# Patient Record
Sex: Female | Born: 1950 | Race: White | Hispanic: No | Marital: Single | Smoking: Former smoker
Health system: Southern US, Community
[De-identification: ages and names within clinical notes are randomized; demographics above are authoritative.]

## PROBLEM LIST (undated history)

## (undated) DIAGNOSIS — J449 Chronic obstructive pulmonary disease, unspecified: Secondary | ICD-10-CM

## (undated) DIAGNOSIS — I10 Essential (primary) hypertension: Secondary | ICD-10-CM

## (undated) DIAGNOSIS — I509 Heart failure, unspecified: Secondary | ICD-10-CM

## (undated) DIAGNOSIS — N19 Unspecified kidney failure: Secondary | ICD-10-CM

## (undated) DIAGNOSIS — M1990 Unspecified osteoarthritis, unspecified site: Secondary | ICD-10-CM

## (undated) DIAGNOSIS — E785 Hyperlipidemia, unspecified: Secondary | ICD-10-CM

## (undated) HISTORY — PX: REPLACEMENT TOTAL KNEE: SUR1224

## (undated) HISTORY — PX: INSERTION OF DIALYSIS CATHETER: SHX1324

## (undated) HISTORY — PX: ABDOMINAL HYSTERECTOMY: SHX81

---

## 2016-10-06 ENCOUNTER — Observation Stay
Admission: EM | Admit: 2016-10-06 | Discharge: 2016-10-08 | Disposition: A | Discharge status: Home / Self Care | Payer: Medicare Other | Attending: Internal Medicine | Admitting: Internal Medicine

## 2016-10-06 ENCOUNTER — Encounter: Payer: Self-pay | Admitting: Emergency Medicine

## 2016-10-06 ENCOUNTER — Emergency Department: Payer: Medicare Other

## 2016-10-06 DIAGNOSIS — E8770 Fluid overload, unspecified: Secondary | ICD-10-CM | POA: Diagnosis present

## 2016-10-06 DIAGNOSIS — I080 Rheumatic disorders of both mitral and aortic valves: Secondary | ICD-10-CM | POA: Insufficient documentation

## 2016-10-06 DIAGNOSIS — R911 Solitary pulmonary nodule: Secondary | ICD-10-CM | POA: Diagnosis not present

## 2016-10-06 DIAGNOSIS — I2510 Atherosclerotic heart disease of native coronary artery without angina pectoris: Secondary | ICD-10-CM | POA: Insufficient documentation

## 2016-10-06 DIAGNOSIS — Z91013 Allergy to seafood: Secondary | ICD-10-CM | POA: Diagnosis not present

## 2016-10-06 DIAGNOSIS — R042 Hemoptysis: Secondary | ICD-10-CM | POA: Diagnosis not present

## 2016-10-06 DIAGNOSIS — N2889 Other specified disorders of kidney and ureter: Secondary | ICD-10-CM

## 2016-10-06 DIAGNOSIS — G4733 Obstructive sleep apnea (adult) (pediatric): Secondary | ICD-10-CM | POA: Diagnosis not present

## 2016-10-06 DIAGNOSIS — Z9981 Dependence on supplemental oxygen: Secondary | ICD-10-CM | POA: Insufficient documentation

## 2016-10-06 DIAGNOSIS — J189 Pneumonia, unspecified organism: Secondary | ICD-10-CM | POA: Insufficient documentation

## 2016-10-06 DIAGNOSIS — N2581 Secondary hyperparathyroidism of renal origin: Secondary | ICD-10-CM | POA: Insufficient documentation

## 2016-10-06 DIAGNOSIS — I509 Heart failure, unspecified: Secondary | ICD-10-CM | POA: Insufficient documentation

## 2016-10-06 DIAGNOSIS — I890 Lymphedema, not elsewhere classified: Secondary | ICD-10-CM | POA: Insufficient documentation

## 2016-10-06 DIAGNOSIS — Z992 Dependence on renal dialysis: Secondary | ICD-10-CM | POA: Insufficient documentation

## 2016-10-06 DIAGNOSIS — E278 Other specified disorders of adrenal gland: Secondary | ICD-10-CM | POA: Insufficient documentation

## 2016-10-06 DIAGNOSIS — E785 Hyperlipidemia, unspecified: Secondary | ICD-10-CM | POA: Diagnosis not present

## 2016-10-06 DIAGNOSIS — N186 End stage renal disease: Secondary | ICD-10-CM | POA: Insufficient documentation

## 2016-10-06 DIAGNOSIS — E669 Obesity, unspecified: Secondary | ICD-10-CM | POA: Insufficient documentation

## 2016-10-06 DIAGNOSIS — M6281 Muscle weakness (generalized): Secondary | ICD-10-CM

## 2016-10-06 DIAGNOSIS — I959 Hypotension, unspecified: Secondary | ICD-10-CM | POA: Insufficient documentation

## 2016-10-06 DIAGNOSIS — F329 Major depressive disorder, single episode, unspecified: Secondary | ICD-10-CM | POA: Insufficient documentation

## 2016-10-06 DIAGNOSIS — R0602 Shortness of breath: Secondary | ICD-10-CM | POA: Diagnosis present

## 2016-10-06 DIAGNOSIS — M1990 Unspecified osteoarthritis, unspecified site: Secondary | ICD-10-CM | POA: Insufficient documentation

## 2016-10-06 DIAGNOSIS — D631 Anemia in chronic kidney disease: Secondary | ICD-10-CM | POA: Insufficient documentation

## 2016-10-06 DIAGNOSIS — Z87891 Personal history of nicotine dependence: Secondary | ICD-10-CM | POA: Diagnosis not present

## 2016-10-06 DIAGNOSIS — R161 Splenomegaly, not elsewhere classified: Secondary | ICD-10-CM | POA: Diagnosis not present

## 2016-10-06 DIAGNOSIS — J440 Chronic obstructive pulmonary disease with acute lower respiratory infection: Secondary | ICD-10-CM | POA: Diagnosis not present

## 2016-10-06 DIAGNOSIS — I132 Hypertensive heart and chronic kidney disease with heart failure and with stage 5 chronic kidney disease, or end stage renal disease: Principal | ICD-10-CM | POA: Insufficient documentation

## 2016-10-06 DIAGNOSIS — Z6830 Body mass index (BMI) 30.0-30.9, adult: Secondary | ICD-10-CM | POA: Insufficient documentation

## 2016-10-06 DIAGNOSIS — Z96659 Presence of unspecified artificial knee joint: Secondary | ICD-10-CM | POA: Insufficient documentation

## 2016-10-06 HISTORY — DX: Chronic obstructive pulmonary disease, unspecified: J44.9

## 2016-10-06 HISTORY — DX: Heart failure, unspecified: I50.9

## 2016-10-06 HISTORY — DX: Essential (primary) hypertension: I10

## 2016-10-06 HISTORY — DX: Unspecified osteoarthritis, unspecified site: M19.90

## 2016-10-06 HISTORY — DX: Unspecified kidney failure: N19

## 2016-10-06 HISTORY — DX: Hyperlipidemia, unspecified: E78.5

## 2016-10-06 LAB — COMPREHENSIVE METABOLIC PANEL
ALT: 16 U/L (ref 14–54)
ANION GAP: 13 (ref 5–15)
AST: 27 U/L (ref 15–41)
Albumin: 3.9 g/dL (ref 3.5–5.0)
Alkaline Phosphatase: 154 U/L — ABNORMAL HIGH (ref 38–126)
BILIRUBIN TOTAL: 1.7 mg/dL — AB (ref 0.3–1.2)
BUN: 29 mg/dL — ABNORMAL HIGH (ref 6–20)
CO2: 28 mmol/L (ref 22–32)
Calcium: 8.8 mg/dL — ABNORMAL LOW (ref 8.9–10.3)
Chloride: 100 mmol/L — ABNORMAL LOW (ref 101–111)
Creatinine, Ser: 5.35 mg/dL — ABNORMAL HIGH (ref 0.44–1.00)
GFR calc Af Amer: 9 mL/min — ABNORMAL LOW (ref >60)
GFR, EST NON AFRICAN AMERICAN: 8 mL/min — AB (ref >60)
Glucose, Bld: 87 mg/dL (ref 65–99)
POTASSIUM: 4.2 mmol/L (ref 3.5–5.1)
Sodium: 141 mmol/L (ref 135–145)
TOTAL PROTEIN: 7.4 g/dL (ref 6.5–8.1)

## 2016-10-06 LAB — CBC WITH DIFFERENTIAL/PLATELET
Basophils Absolute: 0 10*3/uL (ref 0–0.1)
Basophils Relative: 1 %
Eosinophils Absolute: 0 10*3/uL (ref 0–0.7)
Eosinophils Relative: 1 %
HEMATOCRIT: 29.7 % — AB (ref 35.0–47.0)
Hemoglobin: 9.9 g/dL — ABNORMAL LOW (ref 12.0–16.0)
LYMPHS PCT: 12 %
Lymphs Abs: 0.4 10*3/uL — ABNORMAL LOW (ref 1.0–3.6)
MCH: 33.1 pg (ref 26.0–34.0)
MCHC: 33.3 g/dL (ref 32.0–36.0)
MCV: 99.5 fL (ref 80.0–100.0)
MONO ABS: 0.2 10*3/uL (ref 0.2–0.9)
MONOS PCT: 5 %
NEUTROS ABS: 3 10*3/uL (ref 1.4–6.5)
Neutrophils Relative %: 81 %
Platelets: 121 10*3/uL — ABNORMAL LOW (ref 150–440)
RBC: 2.99 MIL/uL — ABNORMAL LOW (ref 3.80–5.20)
RDW: 18.8 % — AB (ref 11.5–14.5)
WBC: 3.7 10*3/uL (ref 3.6–11.0)

## 2016-10-06 LAB — CREATININE, SERUM
CREATININE: 5.76 mg/dL — AB (ref 0.44–1.00)
GFR calc Af Amer: 8 mL/min — ABNORMAL LOW (ref >60)
GFR, EST NON AFRICAN AMERICAN: 7 mL/min — AB (ref >60)

## 2016-10-06 LAB — BRAIN NATRIURETIC PEPTIDE: B NATRIURETIC PEPTIDE 5: 4168 pg/mL — AB (ref 0.0–100.0)

## 2016-10-06 LAB — CBC
HCT: 27.5 % — ABNORMAL LOW (ref 35.0–47.0)
Hemoglobin: 9.2 g/dL — ABNORMAL LOW (ref 12.0–16.0)
MCH: 32.8 pg (ref 26.0–34.0)
MCHC: 33.3 g/dL (ref 32.0–36.0)
MCV: 98.4 fL (ref 80.0–100.0)
Platelets: 104 10*3/uL — ABNORMAL LOW (ref 150–440)
RBC: 2.8 MIL/uL — ABNORMAL LOW (ref 3.80–5.20)
RDW: 19.1 % — ABNORMAL HIGH (ref 11.5–14.5)
WBC: 3.6 10*3/uL (ref 3.6–11.0)

## 2016-10-06 LAB — RENAL FUNCTION PANEL
ALBUMIN: 3.4 g/dL — AB (ref 3.5–5.0)
Anion gap: 9 (ref 5–15)
BUN: 29 mg/dL — AB (ref 6–20)
CALCIUM: 8.5 mg/dL — AB (ref 8.9–10.3)
CO2: 30 mmol/L (ref 22–32)
CREATININE: 5.57 mg/dL — AB (ref 0.44–1.00)
Chloride: 103 mmol/L (ref 101–111)
GFR calc Af Amer: 8 mL/min — ABNORMAL LOW (ref >60)
GFR calc non Af Amer: 7 mL/min — ABNORMAL LOW (ref >60)
GLUCOSE: 109 mg/dL — AB (ref 65–99)
PHOSPHORUS: 4.5 mg/dL (ref 2.5–4.6)
Potassium: 4.3 mmol/L (ref 3.5–5.1)
SODIUM: 142 mmol/L (ref 135–145)

## 2016-10-06 LAB — TROPONIN I
Troponin I: <0.03 ng/mL (ref <0.03)
Troponin I: <0.03 ng/mL (ref <0.03)

## 2016-10-06 MED ORDER — IPRATROPIUM-ALBUTEROL 0.5-2.5 (3) MG/3ML IN SOLN
3.0000 mL | Freq: Three times a day (TID) | RESPIRATORY_TRACT | Status: DC
Start: 2016-10-06 — End: 2016-10-06
  Administered 2016-10-06: 3 mL via RESPIRATORY_TRACT
  Filled 2016-10-06: qty 3

## 2016-10-06 MED ORDER — HEPARIN SODIUM (PORCINE) 5000 UNIT/ML IJ SOLN
5000.0000 [IU] | Freq: Three times a day (TID) | INTRAMUSCULAR | Status: DC
  Administered 2016-10-06 – 2016-10-08 (×5): 5000 [IU] via SUBCUTANEOUS
  Filled 2016-10-06 (×5): qty 1

## 2016-10-06 MED ORDER — DOCUSATE SODIUM 100 MG PO CAPS
100.0000 mg | ORAL_CAPSULE | Freq: Two times a day (BID) | ORAL | Status: DC
  Administered 2016-10-07 – 2016-10-08 (×3): 100 mg via ORAL
  Filled 2016-10-06 (×4): qty 1

## 2016-10-06 MED ORDER — ACETAMINOPHEN 325 MG PO TABS: 650.0000 mg | ORAL_TABLET | Freq: Four times a day (QID) | ORAL | Status: DC | PRN

## 2016-10-06 MED ORDER — SODIUM CHLORIDE 0.9 % IV SOLN: 250.0000 mL | INTRAVENOUS | Status: DC | PRN

## 2016-10-06 MED ORDER — RENA-VITE PO TABS
1.0000 | ORAL_TABLET | Freq: Every day | ORAL | Status: DC
  Administered 2016-10-06 – 2016-10-08 (×3): 1 via ORAL
  Filled 2016-10-06 (×3): qty 1

## 2016-10-06 MED ORDER — ACETAMINOPHEN 650 MG RE SUPP: 650.0000 mg | Freq: Four times a day (QID) | RECTAL | Status: DC | PRN

## 2016-10-06 MED ORDER — OXYCODONE HCL 5 MG PO TABS
5.0000 mg | ORAL_TABLET | Freq: Three times a day (TID) | ORAL | Status: DC
  Administered 2016-10-06 – 2016-10-08 (×6): 5 mg via ORAL
  Filled 2016-10-06 (×6): qty 1

## 2016-10-06 MED ORDER — FUROSEMIDE 10 MG/ML IJ SOLN
60.0000 mg | Freq: Once | INTRAMUSCULAR | Status: AC
  Administered 2016-10-06: 60 mg via INTRAVENOUS
  Filled 2016-10-06: qty 8

## 2016-10-06 MED ORDER — SERTRALINE HCL 100 MG PO TABS
100.0000 mg | ORAL_TABLET | Freq: Every day | ORAL | Status: DC
  Administered 2016-10-06 – 2016-10-07 (×2): 100 mg via ORAL
  Filled 2016-10-06 (×2): qty 1

## 2016-10-06 MED ORDER — TRAZODONE HCL 50 MG PO TABS
25.0000 mg | ORAL_TABLET | Freq: Every evening | ORAL | Status: DC | PRN
  Administered 2016-10-07 (×2): 25 mg via ORAL
  Filled 2016-10-06 (×2): qty 1

## 2016-10-06 MED ORDER — LABETALOL HCL 200 MG PO TABS
200.0000 mg | ORAL_TABLET | Freq: Two times a day (BID) | ORAL | Status: DC
  Administered 2016-10-06: 200 mg via ORAL
  Filled 2016-10-06 (×2): qty 1

## 2016-10-06 MED ORDER — SODIUM CHLORIDE 0.9% FLUSH
3.0000 mL | Freq: Two times a day (BID) | INTRAVENOUS | Status: DC
  Administered 2016-10-06 – 2016-10-08 (×4): 3 mL via INTRAVENOUS

## 2016-10-06 MED ORDER — BISACODYL 5 MG PO TBEC: 5.0000 mg | DELAYED_RELEASE_TABLET | Freq: Every day | ORAL | Status: DC | PRN

## 2016-10-06 MED ORDER — ONDANSETRON HCL 4 MG/2ML IJ SOLN: 4.0000 mg | Freq: Four times a day (QID) | INTRAMUSCULAR | Status: DC | PRN

## 2016-10-06 MED ORDER — IPRATROPIUM-ALBUTEROL 0.5-2.5 (3) MG/3ML IN SOLN
3.0000 mL | Freq: Three times a day (TID) | RESPIRATORY_TRACT | Status: DC
  Administered 2016-10-07 – 2016-10-08 (×3): 3 mL via RESPIRATORY_TRACT
  Filled 2016-10-06 (×4): qty 3

## 2016-10-06 MED ORDER — ONDANSETRON HCL 4 MG PO TABS
4.0000 mg | ORAL_TABLET | Freq: Four times a day (QID) | ORAL | Status: DC | PRN
  Administered 2016-10-07: 4 mg via ORAL
  Filled 2016-10-06: qty 1

## 2016-10-06 MED ORDER — HYDROCODONE-ACETAMINOPHEN 5-325 MG PO TABS: 1.0000 | ORAL_TABLET | ORAL | Status: DC | PRN

## 2016-10-06 MED ORDER — SODIUM CHLORIDE 0.9% FLUSH: 3.0000 mL | INTRAVENOUS | Status: DC | PRN

## 2016-10-06 MED ORDER — NITROGLYCERIN 2 % TD OINT
0.5000 [in_us] | TOPICAL_OINTMENT | Freq: Four times a day (QID) | TRANSDERMAL | Status: DC
  Administered 2016-10-06 – 2016-10-07 (×5): 0.5 [in_us] via TOPICAL
  Filled 2016-10-06 (×6): qty 1

## 2016-10-06 MED ORDER — ALBUTEROL SULFATE (2.5 MG/3ML) 0.083% IN NEBU: 2.5000 mg | INHALATION_SOLUTION | Freq: Four times a day (QID) | RESPIRATORY_TRACT | Status: DC | PRN

## 2016-10-06 MED ORDER — ALBUTEROL SULFATE (2.5 MG/3ML) 0.083% IN NEBU
5.0000 mg | INHALATION_SOLUTION | Freq: Once | RESPIRATORY_TRACT | Status: AC
  Administered 2016-10-06: 5 mg via RESPIRATORY_TRACT
  Filled 2016-10-06: qty 6

## 2016-10-06 MED ORDER — HYDRALAZINE HCL 25 MG PO TABS
25.0000 mg | ORAL_TABLET | Freq: Three times a day (TID) | ORAL | Status: DC
  Administered 2016-10-07 – 2016-10-08 (×3): 25 mg via ORAL
  Filled 2016-10-06 (×5): qty 1

## 2016-10-06 MED ORDER — IPRATROPIUM-ALBUTEROL 0.5-2.5 (3) MG/3ML IN SOLN: 3.0000 mL | Freq: Four times a day (QID) | RESPIRATORY_TRACT | Status: DC

## 2016-10-06 MED ORDER — CINACALCET HCL 30 MG PO TABS
30.0000 mg | ORAL_TABLET | Freq: Every day | ORAL | Status: DC
  Administered 2016-10-07 – 2016-10-08 (×2): 30 mg via ORAL
  Filled 2016-10-06 (×2): qty 1

## 2016-10-06 NOTE — ED Provider Notes (Signed)
[facility] Emergency Department Provider Note  ____________________________________________   I have reviewed the triage vital signs and the nursing notes.   HISTORY  Chief Complaint Shortness of Breath    HPI Sheri Myers is a 65 y.o. female who presents complaining of shortness of breath. Patient has a history of CHF COPD on 2 L home oxygen she states she moved her wheelchair this morning and became so when she could not breathe. She went to dialysis where she is scheduled to have dialysis today, they elected to send her here instead of performing dialysis because she was so short of breath. Patient is in the process of moving from [place]. She does have dialysis here but is yet to see her primary care doctor. She also has a history of CHF. Patient has not been taking any of her medications for the last few days and she cannot recall the name of any of her medications. She states something about the move and it hard for her to take her meds. Patient does not have a fever or productive cough. She denies chest pain she is just very short of breath. Patient states she was on her oxygen all night and that it was functioning.      Past Medical History:  Diagnosis Date  . Arthritis   . CHF (congestive heart failure) (HCC)   . COPD (chronic obstructive pulmonary disease) (HCC)   . Hypertension   . Kidney failure     There are no active problems to display for this patient.   Past Surgical History:  Procedure Laterality Date  . ABDOMINAL HYSTERECTOMY    . INSERTION OF DIALYSIS CATHETER    . REPLACEMENT TOTAL KNEE      Prior to Admission medications   Not on File    Allergies Shellfish allergy  No family history on file.  Social History Social History  Substance Use Topics  . Smoking status: Former Games developermoker  . Smokeless tobacco: Never Used  . Alcohol use No    Review of Systems Constitutional: No fever/chills Eyes: No visual  changes. ENT: No sore throat. No stiff neck no neck pain Cardiovascular: Denies chest pain. Respiratory: Positive shortness of breath. Gastrointestinal:   no vomiting.  No diarrhea.  No constipation. Genitourinary: Negative for dysuria. Musculoskeletal: Negative lower extremity swelling Skin: Negative for rash. Neurological: Negative for severe headaches, focal weakness or numbness. 10-point ROS otherwise negative.  ____________________________________________   PHYSICAL EXAM:  VITAL SIGNS: ED Triage Vitals  Enc Vitals Group     BP 10/06/16 1236 (!) 188/144     Pulse Rate 10/06/16 1236 89     Resp 10/06/16 1236 (!) 32     Temp 10/06/16 1236 97.6 F (36.4 C)     Temp Source 10/06/16 1236 Oral     SpO2 10/06/16 1236 99 %     Weight 10/06/16 1238 150 lb (68 kg)     Height 10/06/16 1238 5\' 2"  (1.575 m)     Head Circumference --      Peak Flow --      Pain Score --      Pain Loc --      Pain Edu? --      Excl. in GC? --     Constitutional: Alert and oriented. Well appearing and in no acute distress. Eyes: Conjunctivae are normal. PERRL. EOMI. Head: Atraumatic. Nose: No congestion/rhinnorhea. Mouth/Throat: Mucous membranes are moist.  Oropharynx non-erythematous. Neck: No stridor.   Nontender with no  meningismus Cardiovascular: Normal rate, regular rhythm. Grossly normal heart sounds.  Good peripheral circulation. Respiratory: Patient with increased respiratory rate, slight wheezes diminished in the bases. Abdominal: Soft and nontender. No distention. No guarding no rebound Back:  There is no focal tenderness or step off.  there is no midline tenderness there are no lesions noted. there is no CVA tenderness Musculoskeletal: No lower extremity tenderness, no upper extremity tenderness. No joint effusions, no DVT signs strong distal pulses no edema Neurologic:  Normal speech and language. No gross focal neurologic deficits are appreciated.  Skin:  Skin is warm, dry and intact.  No rash noted. Psychiatric: Mood and affect are normal. Speech and behavior are normal.  ____________________________________________   LABS (all labs ordered are listed, but only abnormal results are displayed)  Labs Reviewed  BRAIN NATRIURETIC PEPTIDE - Abnormal; Notable for the following:       Result Value   B Natriuretic Peptide 4,168.0 (*)    All other components within normal limits  CBC WITH DIFFERENTIAL/PLATELET - Abnormal; Notable for the following:    RBC 2.99 (*)    Hemoglobin 9.9 (*)    HCT 29.7 (*)    RDW 18.8 (*)    Platelets 121 (*)    Lymphs Abs 0.4 (*)    All other components within normal limits  COMPREHENSIVE METABOLIC PANEL - Abnormal; Notable for the following:    Chloride 100 (*)    BUN 29 (*)    Creatinine, Ser 5.35 (*)    Calcium 8.8 (*)    Alkaline Phosphatase 154 (*)    Total Bilirubin 1.7 (*)    GFR calc non Af Amer 8 (*)    GFR calc Af Amer 9 (*)    All other components within normal limits  TROPONIN I   ____________________________________________  EKG  I personally interpreted any EKGs ordered by me or triage Sinus rhythm rate 52 beats per an acute ST elevation or depression normal axis bradycardia noted. ____________________________________________  RADIOLOGY  I reviewed any imaging ordered by me or triage that were performed during my shift and, if possible, patient and/or family made aware of any abnormal findings. ____________________________________________   PROCEDURES  Procedure(s) performed: None  Procedures  Critical Care performed: None  ____________________________________________   INITIAL IMPRESSION / ASSESSMENT AND PLAN / ED COURSE  Pertinent labs & imaging results that were available during my care of the patient were reviewed by me and considered in my medical decision making (see chart for details).  Patient significantly short of breath. She denies history of panic attacks which she was somewhat anxious.  Blood pressure was quite high. We did elect to give her breathing treatment and observe her. At this time she is breathing much more easily, oxygen saturation is reassuring chest x-ray shows atelectasis versus pneumonia. Given absence of fever or productive cough I tend to think this is more likely atelectatic. She has had episodes like this before. Unfortunately she has not of her home medications, has missed dialysis today, and still feels somewhat short of breath when she moves. I think she likely would benefit from an observational stay  Clinical Course    ____________________________________________   FINAL CLINICAL IMPRESSION(S) / ED DIAGNOSES  Final diagnoses:  SOB (shortness of breath)  SOB (shortness of breath)      This chart was dictated using voice recognition software.  Despite best efforts to proofread,  errors can occur which can change meaning.      Jeanmarie Plant,  MD 10/06/16 1416

## 2016-10-06 NOTE — Consult Note (Signed)
[facility]  CONSULT NOTE    Date: 10/06/2016                  Patient Name:  Sheri PennaDeborah Myers  MRN: [id number]  DOB: 10-26-1951  Age / Sex: 65 y.o., female         PCP: Jerl MinaJames Hedrick, MD                 Service Requesting Consult: Dr. Karlene LinemanV Shah                 Reason for Consult: End Stage Renal Disease            History of Present Illness: Sheri Myers is a 65 y.o. white female with end stage renal disease on hemodialysis since 2007, COPD, obesity, hypertension, congestive heart failure, coronary artery disease, obstructive sleep apnea, hyperlipidemia, lymphedema, who was [facility] to [facility] on 10/06/2016 for end stage renal disease.   Patient recently moved from [place]. [facility] [address] She went for dialysis, but was sent to ED due to shortness of breath.    Medications: Outpatient medications: No prescriptions prior to admission.    Current medications: No current facility-administered medications for this encounter.       Allergies: Allergies  Allergen Reactions  . Shellfish Allergy Hives      Past Medical History: Past Medical History:  Diagnosis Date  . Arthritis   . CHF (congestive heart failure) (HCC)   . COPD (chronic obstructive pulmonary disease) (HCC)   . Hypertension   . Kidney failure      Past Surgical History: Past Surgical History:  Procedure Laterality Date  . ABDOMINAL HYSTERECTOMY    . INSERTION OF DIALYSIS CATHETER    . REPLACEMENT TOTAL KNEE       Family History: No family history on file.   Social History: Social History   Social History  . Marital status: Single    Spouse name: N/A  . Number of children: N/A  . Years of education: N/A   Occupational History  . Not on file.   Social History Main Topics  . Smoking status: Former Games developermoker  . Smokeless tobacco: Never Used  . Alcohol use No  . Drug use: Unknown  . Sexual activity: Not on file   Other Topics Concern  .  Not on file   Social History Narrative  . No narrative on file     Review of Systems: Review of Systems  Constitutional: Positive for diaphoresis, malaise/fatigue and weight loss. Negative for chills and fever.  HENT: Negative.  Negative for congestion, ear discharge, ear pain, hearing loss, nosebleeds, sinus pain, sore throat and tinnitus.   Eyes: Negative for blurred vision, double vision, photophobia, pain, discharge and redness.  Respiratory: Positive for cough, shortness of breath and wheezing. Negative for sputum production and stridor.   Cardiovascular: Positive for orthopnea, leg swelling and PND. Negative for chest pain, palpitations and claudication.  Gastrointestinal: Negative.  Negative for abdominal pain, blood in stool, constipation, diarrhea, heartburn, melena, nausea and vomiting.  Genitourinary: Negative.  Negative for dysuria, flank pain, frequency, hematuria and urgency.  Musculoskeletal: Negative.  Negative for back pain, falls, joint pain, myalgias and neck pain.  Skin: Negative.  Negative for itching and rash.  Neurological: Negative.  Negative for dizziness, tingling, tremors, sensory change, speech change, focal weakness, seizures, loss of consciousness, weakness and headaches.  Endo/Heme/Allergies: Negative.  Negative for environmental allergies and polydipsia. Does not  bruise/bleed easily.  Psychiatric/Behavioral: Negative.  Negative for depression, hallucinations, memory loss, substance abuse and suicidal ideas. The patient is not nervous/anxious and does not have insomnia.     Vital Signs: Blood pressure (!) 175/47, pulse 60, temperature 97.5 F (36.4 C), temperature source Oral, resp. rate 17, height 5\' 2"  (1.575 m), weight 68 kg (150 lb), SpO2 95 %.  Weight trends: Filed Weights   10/06/16 1238  Weight: 68 kg (150 lb)    Physical Exam: General: NAD, laying shortness of breath   Head: Normocephalic, atraumatic. Moist oral mucosal membranes  Eyes:  Anicteric, PERRL  Neck: Supple, trachea midline  Lungs:  Bilateral crackles  Heart: Regular rate and rhythm  Abdomen:  Soft, nontender, obese  Extremities:  1+ peripheral edema.  Neurologic: Nonfocal, moving all four extremities  Skin: No lesions  Access: Left arm AVF annuerysmal     Lab results: Basic Metabolic Panel:  Recent Labs Lab 10/06/16 1254  NA 141  K 4.2  CL 100*  CO2 28  GLUCOSE 87  BUN 29*  CREATININE 5.35*  CALCIUM 8.8*    Liver Function Tests:  Recent Labs Lab 10/06/16 1254  AST 27  ALT 16  ALKPHOS 154*  BILITOT 1.7*  PROT 7.4  ALBUMIN 3.9   No results for input(s): LIPASE, AMYLASE in the last 168 hours. No results for input(s): AMMONIA in the last 168 hours.  CBC:  Recent Labs Lab 10/06/16 1254  WBC 3.7  NEUTROABS 3.0  HGB 9.9*  HCT 29.7*  MCV 99.5  PLT 121*    Cardiac Enzymes:  Recent Labs Lab 10/06/16 1254  TROPONINI <0.03    BNP: Invalid input(s): POCBNP  CBG: No results for input(s): GLUCAP in the last 168 hours.  Microbiology: No results found for this or any previous visit.  Coagulation Studies: No results for input(s): LABPROT, INR in the last 72 hours.  Urinalysis: No results for input(s): COLORURINE, LABSPEC, PHURINE, GLUCOSEU, HGBUR, BILIRUBINUR, KETONESUR, PROTEINUR, UROBILINOGEN, NITRITE, LEUKOCYTESUR in the last 72 hours.  Invalid input(s): APPERANCEUR    Imaging: Dg Chest Port 1 View  Result Date: 10/06/2016 CLINICAL DATA:  Shortness of Breath EXAM: PORTABLE CHEST 1 VIEW COMPARISON:  None. FINDINGS: Cardiomegaly. Left lower lobe atelectasis or infiltrate with small left effusion. Right lung is clear. No acute bony abnormality. IMPRESSION: Left lower lobe atelectasis or pneumonia with small left effusion. Cardiomegaly. Electronically Signed   By: Charlett NoseKevin  Dover M.D.   On: 10/06/2016 13:36      Assessment & Plan: Sheri Myers is a 65 y.o. white female with end stage renal disease on hemodialysis  since 2007, COPD, obesity, hypertension, congestive heart failure, coronary artery disease, obstructive sleep apnea, hyperlipidemia, lymphedema, who was [facility] on 10/06/2016 for end stage renal disease.   [facility] [address]   1. End Stage Renal Disease: with pulmonary edema - schedule dialysis for today. Orders prepared.   2. Hypertension: elevated on admission. Volume driven.  - home regimen of labetalol  3. Anemia of chronic kidney disease: hemoglobin 9.9 - mircera as outpatient.   4. Secondary Hyperparathyroidism:  - not on a binder - cinacalcet  - Check PTH, calcium and phosphorus.  LOS: 0 Sheri Myers 12/6/20175:08 PM

## 2016-10-06 NOTE — ED Triage Notes (Signed)
Pt comes into the ED via EMS from dialysis where she was c/o shortness of breath.  Patient did not get her dialysis done today.  H/o CHF, COPD, renal disorders, HTN.  Patient normally uses motorized wheelchair due to being nonambulatory, but has not had it with her due to a move.  Patient has been using a manual wheelchair where she has now had increased fatigue and increase in her shortness of breath.  99% at 2L.  Patient wears 2L nasal cannula at all times.

## 2016-10-06 NOTE — Progress Notes (Signed)
Dialysis complete

## 2016-10-06 NOTE — Progress Notes (Signed)
Pre Dialysis 

## 2016-10-06 NOTE — ED Notes (Signed)
ED Provider at bedside. 

## 2016-10-06 NOTE — Progress Notes (Signed)
Called Sheri Myers regarding putting daily meds in system.  Sheri AlstromClay, Sheri Myers 10/06/2016  8:21 PM

## 2016-10-06 NOTE — H&P (Signed)
[facility] [place] [facility]   PATIENT NAME: Sheri Myers    MR#:  [id number]  DATE OF BIRTH:  [birth year]  DATE OF ADMISSION:  10/06/2016  PRIMARY CARE PHYSICIAN: Jerl MinaJames Hedrick, MD   REQUESTING/REFERRING PHYSICIAN: Jeanmarie PlantJames A McShane, MD  CHIEF COMPLAINT:   Chief Complaint  Patient presents with  . Shortness of Breath    HISTORY OF PRESENT ILLNESS:  Sheri Myers  is a 65 y.o. female with a known history of CHF, COPD on 2 L home oxygen, end stage renal disease on hemodialysis since 2007, obesity, hypertension, congestive heart failure, coronary artery disease, obstructive sleep apnea, hyperlipidemia, lymphedema being admitted for volume overload requiring emergent HD. She went to dialysis where she is scheduled to have dialysis today, they elected to send her here instead of performing dialysis because she was so short of breath.  Patient is in the process of moving from [place]. She took care of her mother until she died and now she is moving with her daughter here. She does have dialysis here but is yet to see her primary care doctor which is scheduled for tomorrow. Patient has not been taking any of her medications for the last few days and she cannot recall the name of any of her medications. She states something about the move and it hard for her to take her meds. Patient does not have a fever or productive cough. She denies chest pain she is just very short of breath. Patient states she was on her oxygen all night and that it was functioning.complaints of some leg cramps.  PAST MEDICAL HISTORY:   Past Medical History:  Diagnosis Date  . Arthritis   . CHF (congestive heart failure) (HCC)   . COPD (chronic obstructive pulmonary disease) (HCC)   . Hyperlipemia   . Hypertension   . Kidney failure     PAST SURGICAL HISTORY:   Past Surgical History:  Procedure Laterality Date  . ABDOMINAL HYSTERECTOMY    . INSERTION OF DIALYSIS CATHETER    .  REPLACEMENT TOTAL KNEE      SOCIAL HISTORY:   Social History  Substance Use Topics  . Smoking status: Former Smoker    Quit date: 10/06/1990  . Smokeless tobacco: Never Used  . Alcohol use No    FAMILY HISTORY:   Family History  Problem Relation Age of Onset  . Diabetes Mother   mom, dad had pacemaker  DRUG ALLERGIES:   Allergies  Allergen Reactions  . Shellfish Allergy Hives    REVIEW OF SYSTEMS:   Review of Systems  Constitutional: Negative for chills, fever and weight loss.  HENT: Negative for nosebleeds and sore throat.   Eyes: Negative for blurred vision.  Respiratory: Positive for shortness of breath. Negative for cough and wheezing.   Cardiovascular: Negative for chest pain, orthopnea, leg swelling and PND.  Gastrointestinal: Negative for abdominal pain, constipation, diarrhea, heartburn, nausea and vomiting.  Genitourinary: Negative for dysuria and urgency.  Musculoskeletal: Negative for back pain.  Skin: Negative for rash.  Neurological: Negative for dizziness, speech change, focal weakness and headaches.  Endo/Heme/Allergies: Does not bruise/bleed easily.  Psychiatric/Behavioral: Negative for depression.    MEDICATIONS AT HOME:   Prior to Admission medications   Medication Sig Start Date End Date Taking? Authorizing Provider  albuterol (PROVENTIL) (5 MG/ML) 0.5% nebulizer solution Take 2.5 mg by nebulization every 6 (six) hours as needed for wheezing or shortness of breath.   Yes Historical Provider, MD  albuterol-ipratropium (COMBIVENT) 18-103 MCG/ACT inhaler Inhale 1 puff into the lungs every 6 (six) hours.   Yes Historical Provider, MD  cinacalcet (SENSIPAR) 30 MG tablet Take 30 mg by mouth daily.   Yes Historical Provider, MD  ipratropium-albuterol (DUONEB) 0.5-2.5 (3) MG/3ML SOLN Take 3 mLs by nebulization every 8 (eight) hours.   Yes Historical Provider, MD  labetalol (NORMODYNE) 200 MG tablet Take 200 mg by mouth 2 (two) times daily.   Yes  Historical Provider, MD  multivitamin (RENA-VIT) TABS tablet Take 1 tablet by mouth daily.   Yes Historical Provider, MD  oxycodone (OXY-IR) 5 MG capsule Take 5 mg by mouth every 8 (eight) hours.   Yes Historical Provider, MD  sertraline (ZOLOFT) 100 MG tablet Take 100 mg by mouth at bedtime.   Yes Historical Provider, MD      VITAL SIGNS:  Blood pressure (!) 193/53, pulse 65, temperature 97.9 F (36.6 C), temperature source Oral, resp. rate 18, height 5\' 2"  (1.575 m), weight 77.1 kg (169 lb 15.6 oz), SpO2 100 %.  PHYSICAL EXAMINATION:  Physical Exam  GENERAL:  65 y.o.-year-old patient lying in the bed with no acute distress.  EYES: Pupils equal, round, reactive to light and accommodation. No scleral icterus. Extraocular muscles intact.  HEENT: Head atraumatic, normocephalic. Oropharynx and nasopharynx clear.  NECK:  Supple, no jugular venous distention. No thyroid enlargement, no tenderness.  LUNGS: Normal breath sounds bilaterally, no wheezing, rales,rhonchi or crepitation. No use of accessory muscles of respiration.  CARDIOVASCULAR: S1, S2 normal. No murmurs, rubs, or gallops.  ABDOMEN: Soft, nontender, nondistended. Bowel sounds present. No organomegaly or mass.  EXTREMITIES: No pedal edema, cyanosis, or clubbing.  NEUROLOGIC: Cranial nerves II through XII are intact. Muscle strength 5/5 in all extremities. Sensation intact. Gait not checked.  PSYCHIATRIC: The patient is alert and oriented x 3.  SKIN: No obvious rash, lesion, or ulcer.   LABORATORY PANEL:   CBC  Recent Labs Lab 10/06/16 1744  WBC 3.6  HGB 9.2*  HCT 27.5*  PLT 104*   ------------------------------------------------------------------------------------------------------------------  Chemistries   Recent Labs Lab 10/06/16 1254 10/06/16 1744  NA 141 142  K 4.2 4.3  CL 100* 103  CO2 28 30  GLUCOSE 87 109*  BUN 29* 29*  CREATININE 5.35* 5.76*  5.57*  CALCIUM 8.8* 8.5*  AST 27  --   ALT 16  --     ALKPHOS 154*  --   BILITOT 1.7*  --    ------------------------------------------------------------------------------------------------------------------  Cardiac Enzymes  Recent Labs Lab 10/06/16 1744  TROPONINI <0.03   ------------------------------------------------------------------------------------------------------------------  RADIOLOGY:  Dg Chest Port 1 View  Result Date: 10/06/2016 CLINICAL DATA:  Shortness of Breath EXAM: PORTABLE CHEST 1 VIEW COMPARISON:  None. FINDINGS: Cardiomegaly. Left lower lobe atelectasis or infiltrate with small left effusion. Right lung is clear. No acute bony abnormality. IMPRESSION: Left lower lobe atelectasis or pneumonia with small left effusion. Cardiomegaly. Electronically Signed   By: Charlett NoseKevin  Dover M.D.   On: 10/06/2016 13:36   IMPRESSION AND PLAN:  Ms. Sheri Myers is a 65 y.o. white female with end stage renal disease on hemodialysis since 2007, COPD, obesity, hypertension, congestive heart failure, coronary artery disease, obstructive sleep apnea, hyperlipidemia, lymphedema, being admitted for emergent HD due to volume overload and missed HD  * Volume overload - d/w nephro, will get emergent HD tonight - yet to start her outpt M-W-F HD  * Uncontrolled HTN - continue labetolol and add hydralazine for better BP control  * depression -  continue zoloft  * Anemia of ESRD - monitor, stable     All the records are reviewed and case discussed with ED provider. Management plans discussed with the patient, family and they are in agreement.  CODE STATUS: full code  TOTAL TIME TAKING CARE OF THIS PATIENT: 45 minutes.    Delfino Lovett M.D on 10/06/2016 at 9:54 PM  Between 7am to 6pm - Pager - [phone]  After 6pm go to [email or web address] - Social research officer, government  [facility] [place] [facility]  Office  [phone]  CC: Primary care physician; Jerl Mina, MD   Note: This dictation was prepared with Dragon dictation  along with smaller phrase technology. Any transcriptional errors that result from this process are unintentional.

## 2016-10-06 NOTE — Progress Notes (Signed)
Called Dr. Sherryll BurgerShah regarding blood pressure and home meds for patient.  [name], [name]  10/06/2016  9:01 PM

## 2016-10-06 NOTE — Care Management Note (Signed)
Case Management Note  Patient Details  Name: Sheri Myers MRN: [id number] Date of Birth: [birth year]  Subjective/Objective:     Patient here in ER with daughter at bedside. [place], after the daughter brought her here 1 week ago.  The patient was in a facility for the last 10 years prior to this. The patient gets HD at [facility] on [address] She got there today and HD was not done due to her SOB. She has an appt. To get established with a PCP tomorrow at Dr. Delia ChimesHedricks office in [place].  Awaiting reponse from [facility] to see what the dispo for the pt. Is.              Action/Plan:   Expected Discharge Date:                  Expected Discharge Plan:     In-House Referral:     Discharge planning Services     Post Acute Care Choice:    Choice offered to:     DME Arranged:    DME Agency:     HH Arranged:    HH Agency:     Status of Service:     If discussed at [employer] Length of Stay Meetings, dates discussed:    Additional Comments:  Berna BueCheryl Rebacca Votaw, RN 10/06/2016, 2:23 PM

## 2016-10-06 NOTE — Progress Notes (Signed)
Dialysis started 

## 2016-10-06 NOTE — Progress Notes (Signed)
Post dialysis 

## 2016-10-07 ENCOUNTER — Observation Stay: Payer: Medicare Other

## 2016-10-07 ENCOUNTER — Observation Stay
Admit: 2016-10-07 | Discharge: 2016-10-07 | Disposition: A | Discharge status: Home / Self Care | Payer: Medicare Other | Attending: Nephrology | Admitting: Nephrology

## 2016-10-07 DIAGNOSIS — I132 Hypertensive heart and chronic kidney disease with heart failure and with stage 5 chronic kidney disease, or end stage renal disease: Secondary | ICD-10-CM | POA: Diagnosis not present

## 2016-10-07 LAB — COMPREHENSIVE METABOLIC PANEL
ALT: 15 U/L (ref 14–54)
AST: 20 U/L (ref 15–41)
Albumin: 3.2 g/dL — ABNORMAL LOW (ref 3.5–5.0)
Alkaline Phosphatase: 136 U/L — ABNORMAL HIGH (ref 38–126)
Anion gap: 8 (ref 5–15)
BUN: 16 mg/dL (ref 6–20)
CHLORIDE: 99 mmol/L — AB (ref 101–111)
CO2: 33 mmol/L — AB (ref 22–32)
CREATININE: 3.23 mg/dL — AB (ref 0.44–1.00)
Calcium: 8.6 mg/dL — ABNORMAL LOW (ref 8.9–10.3)
GFR calc non Af Amer: 14 mL/min — ABNORMAL LOW (ref >60)
GFR, EST AFRICAN AMERICAN: 16 mL/min — AB (ref >60)
Glucose, Bld: 94 mg/dL (ref 65–99)
POTASSIUM: 4.1 mmol/L (ref 3.5–5.1)
SODIUM: 140 mmol/L (ref 135–145)
Total Bilirubin: 1.1 mg/dL (ref 0.3–1.2)
Total Protein: 6.7 g/dL (ref 6.5–8.1)

## 2016-10-07 LAB — CBC
HEMATOCRIT: 25 % — AB (ref 35.0–47.0)
HEMOGLOBIN: 8.4 g/dL — AB (ref 12.0–16.0)
MCH: 33.3 pg (ref 26.0–34.0)
MCHC: 33.8 g/dL (ref 32.0–36.0)
MCV: 98.7 fL (ref 80.0–100.0)
Platelets: 97 10*3/uL — ABNORMAL LOW (ref 150–440)
RBC: 2.53 MIL/uL — AB (ref 3.80–5.20)
RDW: 18.8 % — ABNORMAL HIGH (ref 11.5–14.5)
WBC: 4 10*3/uL (ref 3.6–11.0)

## 2016-10-07 LAB — TROPONIN I: Troponin I: <0.03 ng/mL (ref <0.03)

## 2016-10-07 LAB — HEPATITIS C ANTIBODY

## 2016-10-07 LAB — GLUCOSE, CAPILLARY: Glucose-Capillary: 86 mg/dL (ref 65–99)

## 2016-10-07 LAB — HEPATITIS B SURFACE ANTIBODY,QUALITATIVE: Hep B S Ab: NONREACTIVE

## 2016-10-07 LAB — HEPATITIS B SURFACE ANTIGEN: Hepatitis B Surface Ag: NEGATIVE

## 2016-10-07 LAB — PARATHYROID HORMONE, INTACT (NO CA): PTH: 868 pg/mL — AB (ref 15–65)

## 2016-10-07 LAB — HEPATITIS B CORE ANTIBODY, TOTAL: Hep B Core Total Ab: NEGATIVE

## 2016-10-07 MED ORDER — IOPAMIDOL (ISOVUE-300) INJECTION 61%
15.0000 mL | INTRAVENOUS | Status: AC
  Administered 2016-10-07 (×2): 15 mL via ORAL

## 2016-10-07 NOTE — Evaluation (Signed)
Physical Therapy Evaluation Patient Details Name: Sheri PennaDeborah Kobayashi MRN: [id number] DOB: Jun 16, 1951 Today's Date: 10/07/2016   History of Present Illness  Sheri PennaDeborah Valvano  is a 65 y.o. female with a known history of CHF, COPD on 2 L home oxygen, end stage renal disease on hemodialysis since 2007, obesity, hypertension, congestive heart failure, coronary artery disease, obstructive sleep apnea, hyperlipidemia, lymphedema being admitted for volume overload requiring emergent HD. She went to dialysis where she is scheduled to have dialysis today, they elected to send her here instead of performing dialysis because she was so short of breath. Patient is in the process of moving from [place]. She took care of her mother until she died and now she is moving with her daughter here. She does have dialysis here but is yet to see her primary care doctor which is scheduled for tomorrow. Patient has not been taking any of her medications for the last few days and she cannot recall the name of any of her medications. She states something about the move and it hard for her to take her meds. Patient does not have a fever or productive cough. She denies chest pain she is just very short of breath. Patient states she was on her oxygen all night and that it was functioning. Pt complains of some leg cramps  Clinical Impression  Pt admitted with above diagnosis. Pt currently with functional limitations due to the deficits listed below (see PT Problem List).  Pt reports at baseline she does not ambulate and only performs transfer to/from her wheelchair. Pt is able to demonstrate baseline mobility during evaluation on this date performing bed mobility independently and transfers with supervision only. She is generally deconditioned with DOE during minimal activity. SaO2 at or above 95% on 2 L/min O2. Pt will be safe to return home with family at discharge. Will maintain on caseload during admission to prevent functional decline  but no need for follow-up PT services after discharge. Pt will benefit from skilled PT services to address deficits in strength, balance, and mobility in order to return to full function at home.     Follow Up Recommendations No PT follow up;Supervision - Intermittent    Equipment Recommendations  None recommended by PT    Recommendations for Other Services       Precautions / Restrictions Precautions Precautions: Fall Restrictions Weight Bearing Restrictions: No      Mobility  Bed Mobility Overal bed mobility: Modified Independent             General bed mobility comments: HOB flat but bed rail utilized. Pt able to perform supine to/from sit independently without assist from therapist  Transfers Overall transfer level: Needs assistance Equipment used: Rolling walker (2 wheeled) Transfers: Sit to/from Stand Sit to Stand: Supervision         General transfer comment: Pt able to perform sit to stand with safe hand placement and trunk rocking for momentum. Good stability noted in standing with rolling walker. Pt reports that she has not walked in >10 years and refuses to ambulate at this time. Appears to be at baseline with respect to her mobility  Ambulation/Gait             General Gait Details: Unable/unwilling. Pt reports she has not ambulated in over 10 years  Stairs            Wheelchair Mobility    Modified Rankin (Stroke Patients Only)       Balance Overall balance assessment:  Needs assistance Sitting-balance support: No upper extremity supported Sitting balance-Leahy Scale: Good     Standing balance support: Bilateral upper extremity supported Standing balance-Leahy Scale: Fair Standing balance comment: Pt able to demonstrate fair standing balance with UE support                             Pertinent Vitals/Pain Pain Assessment: No/denies pain    Home Living Family/patient expects to be discharged to:: Private  residence Living Arrangements: Children Available Help at Discharge: Family;Available PRN/intermittently;Other (Comment) (Pt home alone during daytime) Type of Home: House Home Access: Stairs to enter Entrance Stairs-Rails: Lawyer of Steps: 2 Home Layout: One level Home Equipment: Wheelchair - manual;Tub bench;Bedside commode;Walker - 4 wheels Additional Comments: Grandson and dtr have been lifting patient up 2 steps    Prior Function Level of Independence: Needs assistance   Gait / Transfers Assistance Needed: Pt reports she only transfer to/from wheelchair. She states that she has not ambulated in greater than 10 years due to bilateral knee pain with multiple surgeries on L knee  ADL's / Homemaking Assistance Needed: Requires assist with ADLs/IADLs from daughter  Comments: Grandson and dtr have been lifting patient up 2 steps     Hand Dominance   Dominant Hand: Right    Extremity/Trunk Assessment   Upper Extremity Assessment: Generalized weakness           Lower Extremity Assessment: Generalized weakness;RLE deficits/detail         Communication   Communication: No difficulties  Cognition Arousal/Alertness: Awake/alert Behavior During Therapy: WFL for tasks assessed/performed Overall Cognitive Status: Within Functional Limits for tasks assessed                      General Comments      Exercises     Assessment/Plan    PT Assessment Patient needs continued PT services  PT Problem List Decreased strength;Decreased activity tolerance;Cardiopulmonary status limiting activity          PT Treatment Interventions DME instruction;Functional mobility training;Therapeutic activities;Therapeutic exercise;Balance training;Neuromuscular re-education;Patient/family education    PT Goals (Current goals can be found in the Care Plan section)  Acute Rehab PT Goals Patient Stated Goal: Return to prior level of function at home PT  Goal Formulation: With patient Time For Goal Achievement: 10/21/16 Potential to Achieve Goals: Good    Frequency Min 2X/week   Barriers to discharge Decreased caregiver support Pt at home alone during daytime hours. Reports she stays in her chair    Co-evaluation               End of Session Equipment Utilized During Treatment: Gait belt;Oxygen Activity Tolerance: Patient tolerated treatment well Patient left: in bed;with call bell/phone within reach;with bed alarm set Nurse Communication: Mobility status    Functional Assessment Tool Used: clinical judgement Functional Limitation: Mobility: Walking and moving around Mobility: Walking and Moving Around Current Status (Z6109): At least 60 percent but less than 80 percent impaired, limited or restricted Mobility: Walking and Moving Around Goal Status [phone]): At least 60 percent but less than 80 percent impaired, limited or restricted    Time: 1620-1640 PT Time Calculation (min) (ACUTE ONLY): 20 min   Charges:   PT Evaluation $PT Eval Low Complexity: 1 Procedure     PT G Codes:   PT G-Codes **NOT FOR INPATIENT CLASS** Functional Assessment Tool Used: clinical judgement Functional Limitation: Mobility: Walking and moving around  Mobility: Walking and Moving Around Current Status [phone](G8978): At least 60 percent but less than 80 percent impaired, limited or restricted Mobility: Walking and Moving Around Goal Status [phone](G8979): At least 60 percent but less than 80 percent impaired, limited or restricted   Lynnea MaizesJason D Antara Brecheisen PT, DPT   Danie Diehl 10/07/2016, 4:57 PM

## 2016-10-07 NOTE — Plan of Care (Signed)
Problem: Pain Managment: Goal: General experience of comfort will improve Outcome: Progressing Patient has had no complaints of pain this shift.    

## 2016-10-07 NOTE — Progress Notes (Signed)
[facility]  ROUNDING NOTE   Subjective:   Emergent hemodialysis last night. UF of 2 litres. Patient tolerated treatment well.  Today without complaints. Laying in bed comfortably.   Objective:  Vital signs in last 24 hours:  Temp:  [97.4 F (36.3 C)-98.2 F (36.8 C)] 98.2 F (36.8 C) (12/07 0535) Pulse Rate:  [56-89] 59 (12/07 0945) Resp:  [14-32] 18 (12/07 0535) BP: (96-196)/(35-144) 130/57 (12/07 0945) SpO2:  [95 %-100 %] 95 % (12/07 0746) Weight:  [68 kg (150 lb)-77.1 kg (169 lb 15.6 oz)] 75.3 kg (166 lb 0.1 oz) (12/07 0535)  Weight change:  Filed Weights   10/06/16 2212 10/07/16 0500 10/07/16 0535  Weight: 75 kg (165 lb 5.5 oz) 75.2 kg (165 lb 12.6 oz) 75.3 kg (166 lb 0.1 oz)    Intake/Output: I/O last 3 completed shifts: In: 3 [I.V.:3] Out: 2000 [Other:2000]   Intake/Output this shift:  Total I/O In: 120 [P.O.:120] Out: -   Physical Exam: General: NAD,   Head: Normocephalic, atraumatic. Moist oral mucosal membranes  Eyes: Anicteric, PERRL  Neck: Supple, trachea midline  Lungs:  Clear to auscultation  Heart: Regular rate and rhythm  Abdomen:  Soft, nontender,   Extremities: 1+ peripheral edema.  Neurologic: Nonfocal, moving all four extremities  Skin: No lesions  Access: Left AVF annuerysmal    Basic Metabolic Panel:  Recent Labs Lab 10/06/16 1254 10/06/16 1744 10/07/16 0534  NA 141 142 140  K 4.2 4.3 4.1  CL 100* 103 99*  CO2 28 30 33*  GLUCOSE 87 109* 94  BUN 29* 29* 16  CREATININE 5.35* 5.76*  5.57* 3.23*  CALCIUM 8.8* 8.5* 8.6*  PHOS  --  4.5  --     Liver Function Tests:  Recent Labs Lab 10/06/16 1254 10/06/16 1744 10/07/16 0534  AST 27  --  20  ALT 16  --  15  ALKPHOS 154*  --  136*  BILITOT 1.7*  --  1.1  PROT 7.4  --  6.7  ALBUMIN 3.9 3.4* 3.2*   No results for input(s): LIPASE, AMYLASE in the last 168 hours. No results for input(s): AMMONIA in the last 168 hours.  CBC:  Recent Labs Lab 10/06/16 1254  10/06/16 1744 10/07/16 0534  WBC 3.7 3.6 4.0  NEUTROABS 3.0  --   --   HGB 9.9* 9.2* 8.4*  HCT 29.7* 27.5* 25.0*  MCV 99.5 98.4 98.7  PLT 121* 104* 97*    Cardiac Enzymes:  Recent Labs Lab 10/06/16 1254 10/06/16 1744 10/06/16 2319 10/07/16 0534  TROPONINI <0.03 <0.03 <0.03 <0.03    BNP: Invalid input(s): POCBNP  CBG:  Recent Labs Lab 10/07/16 0736  GLUCAP 86    Microbiology: No results found for this or any previous visit.  Coagulation Studies: No results for input(s): LABPROT, INR in the last 72 hours.  Urinalysis: No results for input(s): COLORURINE, LABSPEC, PHURINE, GLUCOSEU, HGBUR, BILIRUBINUR, KETONESUR, PROTEINUR, UROBILINOGEN, NITRITE, LEUKOCYTESUR in the last 72 hours.  Invalid input(s): APPERANCEUR    Imaging: Dg Chest Port 1 View  Result Date: 10/06/2016 CLINICAL DATA:  Shortness of Breath EXAM: PORTABLE CHEST 1 VIEW COMPARISON:  None. FINDINGS: Cardiomegaly. Left lower lobe atelectasis or infiltrate with small left effusion. Right lung is clear. No acute bony abnormality. IMPRESSION: Left lower lobe atelectasis or pneumonia with small left effusion. Cardiomegaly. Electronically Signed   By: Charlett NoseKevin  Dover M.D.   On: 10/06/2016 13:36     Medications:    . cinacalcet  30  mg Oral Q breakfast  . docusate sodium  100 mg Oral BID  . heparin  5,000 Units Subcutaneous Q8H  . hydrALAZINE  25 mg Oral Q8H  . ipratropium-albuterol  3 mL Nebulization Q8H  . labetalol  200 mg Oral BID  . multivitamin  1 tablet Oral Daily  . nitroGLYCERIN  0.5 inch Topical Q6H  . oxyCODONE  5 mg Oral Q8H  . sertraline  100 mg Oral QHS  . sodium chloride flush  3 mL Intravenous Q12H   sodium chloride, acetaminophen **OR** acetaminophen, albuterol, bisacodyl, HYDROcodone-acetaminophen, ondansetron **OR** ondansetron (ZOFRAN) IV, sodium chloride flush, traZODone  Assessment/ Plan:   Ms. Sheri PennaDeborah Myers is a 65 y.o. white female with end stage renal disease on hemodialysis  since 2007, COPD, obesity, hypertension, congestive heart failure, coronary artery disease, obstructive sleep apnea, hyperlipidemia, lymphedema, who [facility] for end stage renal disease.   MWF [facility] [address]   1. End Stage Renal Disease: with pulmonary edema on admission. Emergent hemodialysis treatment last night. UF of 2 litres. No indication for dialysis today - Next treatment for tomorrow.   2. Hypertension: now hypotensive - home regimen of labetalol  3. Anemia of chronic kidney disease: hemoglobin 8.4 - will give EPO inpatient tomorrow.  - mircera as outpatient.   4. Secondary Hyperparathyroidism:  - not on a binder - cinacalcet  - Pending PTH. calcium and phosphorus at goal.    LOS: 0 Kani Jobson 12/7/201711:27 AM

## 2016-10-07 NOTE — Care Management (Signed)
HD info faxed to Susann Givensheryl Brawner.  PT consult pending

## 2016-10-07 NOTE — Progress Notes (Signed)
Initial Nutrition Assessment  DOCUMENTATION CODES:   Not applicable  INTERVENTION:   Snacks- yogurt daily   NUTRITION DIAGNOSIS:   Inadequate oral intake related to poor appetite as evidenced by per patient/family report.  GOAL:   Patient will meet greater than or equal to 90% of their needs  MONITOR:   PO intake  REASON FOR ASSESSMENT:   Malnutrition Screening Tool    ASSESSMENT:   65 y.o. female with a known history of CHF, COPD on 2 L home oxygen, end stage renal disease on hemodialysis since 2007, obesity, hypertension, congestive heart failure, coronary artery disease, obstructive sleep apnea, hyperlipidemia, lymphedema being admitted for volume overload requiring emergent HD.   Met with pt in room today. Pt reports improved appetite and eating 75% meals. Pt reports poor appetite for several days pta. Pt reports that she normally doesn't eat a lot. Pt is unsure if she has had any wt changes. Pt recently moved here from out [place]; no wt records in chart. Pt with ESRD on HD; last treatment 12/6. Pt does not like supplements. Pt currently on 1246m fluid restriction. Pt in electric wheelchair at home. Unable to walk at baseline.    Medications reviewed and include: colace, senispar, MVI, oxycodone, trazadone    Labs reviewed: Cl 99(L), CO2 33(H), creat 3.23(H), Ca 8.6(L) adj. 9.24 wnl, AlkPhos 136(H), Alb 3.2(L), BNP 4168 (h), P 4.5 wnl 12/6   Nutrition-Focused physical exam completed. Findings are no fat depletion, no muscle depletion, and mild/moderate edema.   Diet Order:  Diet renal with fluid restriction Fluid restriction: 1200 mL Fluid; Room service appropriate? Yes; Fluid consistency: Thin  Skin:  Reviewed, no issues  Last BM:  12/6  Height:   Ht Readings from Last 1 Encounters:  10/06/16 _0  (1.575 m)    Weight:   Wt Readings from Last 1 Encounters:  10/07/16 166 lb 0.1 oz (75.3 kg)    Ideal Body Weight:  50 kg  BMI:  Body mass index is 30.36  kg/m.  Estimated Nutritional Needs:   Kcal:  1350-1600kcal/day   Protein:  90-105g/day   Fluid:  fluid restriction per MD  EDUCATION NEEDS:   No education needs identified at this time  CKoleen Distance RD, LDN

## 2016-10-07 NOTE — Progress Notes (Addendum)
[facility] [place] [facility]   PATIENT NAME: Sheri PennaDeborah Dasher    MR#:  [id number]  DATE OF BIRTH:  [birth year]  SUBJECTIVE:  CHIEF COMPLAINT:  Patient is doing much better. Shortness of breath improved. Had bowel movement today  REVIEW OF SYSTEMS:  CONSTITUTIONAL: No fever, fatigue or weakness.  EYES: No blurred or double vision.  EARS, NOSE, AND THROAT: No tinnitus or ear pain.  RESPIRATORY: No cough, shortness of breath, wheezing or hemoptysis.  CARDIOVASCULAR: No chest pain, orthopnea, edema.  GASTROINTESTINAL: No nausea, vomiting, diarrhea or abdominal pain.  GENITOURINARY: No dysuria, hematuria.  ENDOCRINE: No polyuria, nocturia,  HEMATOLOGY: No anemia, easy bruising or bleeding SKIN: No rash or lesion. MUSCULOSKELETAL: No joint pain or arthritis.   NEUROLOGIC: No tingling, numbness, weakness.  PSYCHIATRY: No anxiety or depression.   DRUG ALLERGIES:   Allergies  Allergen Reactions  . Shellfish Allergy Hives    VITALS:  Blood pressure (!) 135/40, pulse 64, temperature 97.9 F (36.6 C), temperature source Oral, resp. rate 20, height 5\' 2"  (1.575 m), weight 75.3 kg (166 lb 0.1 oz), SpO2 97 %.  PHYSICAL EXAMINATION:  GENERAL:  65 y.o.-year-old patient lying in the bed with no acute distress.  EYES: Pupils equal, round, reactive to light and accommodation. No scleral icterus. Extraocular muscles intact.  HEENT: Head atraumatic, normocephalic. Oropharynx and nasopharynx clear.  NECK:  Supple, no jugular venous distention. No thyroid enlargement, no tenderness.  LUNGS:Moderate breath sounds bilaterally, no wheezing, rales,rhonchi or crepitation. No use of accessory muscles of respiration.  CARDIOVASCULAR: S1, S2 normal. No murmurs, rubs, or gallops.  ABDOMEN: Soft, nontender, nondistended. Bowel sounds present. No organomegaly or mass.  EXTREMITIES: No pedal edema, cyanosis, or clubbing.  NEUROLOGIC: Cranial nerves II through XII are intact.  Muscle strength 5/5 in all extremities. Sensation intact. Gait not checked.  PSYCHIATRIC: The patient is alert and oriented x 3.  SKIN: No obvious rash, lesion, or ulcer.    LABORATORY PANEL:   CBC  Recent Labs Lab 10/07/16 0534  WBC 4.0  HGB 8.4*  HCT 25.0*  PLT 97*   ------------------------------------------------------------------------------------------------------------------  Chemistries   Recent Labs Lab 10/07/16 0534  NA 140  K 4.1  CL 99*  CO2 33*  GLUCOSE 94  BUN 16  CREATININE 3.23*  CALCIUM 8.6*  AST 20  ALT 15  ALKPHOS 136*  BILITOT 1.1   ------------------------------------------------------------------------------------------------------------------  Cardiac Enzymes  Recent Labs Lab 10/07/16 0534  TROPONINI <0.03   ------------------------------------------------------------------------------------------------------------------  RADIOLOGY:  Dg Chest Port 1 View  Result Date: 10/06/2016 CLINICAL DATA:  Shortness of Breath EXAM: PORTABLE CHEST 1 VIEW COMPARISON:  None. FINDINGS: Cardiomegaly. Left lower lobe atelectasis or infiltrate with small left effusion. Right lung is clear. No acute bony abnormality. IMPRESSION: Left lower lobe atelectasis or pneumonia with small left effusion. Cardiomegaly. Electronically Signed   By: Charlett NoseKevin  Dover M.D.   On: 10/06/2016 13:36    EKG:   Orders placed or performed during the hospital encounter of 10/06/16  . ED EKG  . ED EKG  . EKG 12-Lead  . EKG 12-Lead    ASSESSMENT AND PLAN:   Ms. Sheri SalvageDeborah Triggsis a 65 y.o. white femalewith end stage renal disease on hemodialysis since 2007, COPD, obesity, hypertension, congestive heart failure, coronary artery disease, obstructive sleep apnea, hyperlipidemia, lymphedema, being admitted for emergent HD due to volume overload and missed HD  * Volume overload - Had an emergent hemodialysis yesterday Clinically doing better Appreciate nephrology  recommendations Scheduled for  repeat hemodialysis in a.m. Echocardiogram and a CT abdomen and pelvis are ordered which are pending - yet to start her outpt M-W-F HD -Hepatitis B surface antigen and HCV are negative  * Uncontrolled HTN-improved - continue labetolol and add hydralazine for better BP control  * depression - continue zoloft  * Anemia of ESRD - monitor, stable -EPO inpatient tomorrow.      All the records are reviewed and case discussed with Care Management/Social Workerr. Management plans discussed with the patient, family and they are in agreement.  CODE STATUS: Full code  TOTAL TIME TAKING CARE OF THIS PATIENT: 37 minutes.   POSSIBLE D/C IN 2 DAYS, DEPENDING ON CLINICAL CONDITION.  Note: This dictation was prepared with Dragon dictation along with smaller phrase technology. Any transcriptional errors that result from this process are unintentional.   Ramonita LabGouru, Larri Yehle M.D on 10/07/2016 at 3:16 PM  Between 7am to 6pm - Pager - [phone] After 6pm go to [email or web address] - password [email or web address] [facility]  [facility]  Office  [phone]  CC: Primary care physician; Jerl MinaJames Hedrick, MD

## 2016-10-08 DIAGNOSIS — I132 Hypertensive heart and chronic kidney disease with heart failure and with stage 5 chronic kidney disease, or end stage renal disease: Secondary | ICD-10-CM | POA: Diagnosis not present

## 2016-10-08 LAB — ECHOCARDIOGRAM COMPLETE
AV Area VTI index: 0.47 cm2/m2
AV Mean grad: 32 mmHg
AV peak Index: 0.51
AV pk vel: 374 cm/s
AVA: 0.87 cm2
AVAREAMEANV: 0.85 cm2
AVAREAMEANVIN: 0.46 cm2/m2
AVAREAVTI: 0.94 cm2
AVCELMEANRAT: 0.33
AVLVOTPG: 8 mmHg
AVPG: 56 mmHg
Ao pk vel: 0.37 m/s
CHL CUP AV VEL: 0.87
CHL CUP LVOT MV VTI: 1.55
CHL CUP MV DEC (S): 292
CHL CUP MV M VEL: 96.8
DOP CAL AO MEAN VELOCITY: 266 cm/s
EERAT: 26.22
EWDT: 292 ms
FS: 40 % (ref 28–44)
HEIGHTINCHES: 62 in
IV/PV OW: 0.55
LA diam index: 2.99 cm/m2
LA vol index: 67.9 mL/m2
LASIZE: 55 mm
LAVOL: 125 mL
LAVOLA4C: 121 mL
LEFT ATRIUM END SYS DIAM: 55 mm
LV E/e'average: 26.22
LV PW d: 12.3 mm — AB (ref 0.6–1.1)
LV e' LATERAL: 7.4 cm/s
LVEEMED: 26.22
LVOT MV VTI INDEX: 0.84 cm2/m2
LVOT VTI: 33.7 cm
LVOT area: 2.54 cm2
LVOT peak VTI: 0.34 cm
LVOT peak vel: 139 cm/s
LVOTD: 18 mm
LVOTSV: 86 mL
Lateral S' vel: 12.6 cm/s
MV Annulus VTI: 55.4 cm
MV VTI: 159 cm
MV pk A vel: 146 m/s
MV pk E vel: 194 m/s
MVG: 5 mmHg
MVPG: 15 mmHg
P 1/2 time: 355 ms
TAPSE: 30.4 mm
TDI e' lateral: 7.4
TDI e' medial: 5.66
VTI: 98.4 cm
Valve area index: 0.47
WEIGHTICAEL: 2656.1 [oz_av]

## 2016-10-08 LAB — CBC
HEMATOCRIT: 26.8 % — AB (ref 35.0–47.0)
Hemoglobin: 8.7 g/dL — ABNORMAL LOW (ref 12.0–16.0)
MCH: 32.3 pg (ref 26.0–34.0)
MCHC: 32.5 g/dL (ref 32.0–36.0)
MCV: 99.5 fL (ref 80.0–100.0)
Platelets: 101 10*3/uL — ABNORMAL LOW (ref 150–440)
RBC: 2.69 MIL/uL — ABNORMAL LOW (ref 3.80–5.20)
RDW: 19.6 % — AB (ref 11.5–14.5)
WBC: 4.1 10*3/uL (ref 3.6–11.0)

## 2016-10-08 LAB — BASIC METABOLIC PANEL
ANION GAP: 9 (ref 5–15)
BUN: 25 mg/dL — AB (ref 6–20)
CALCIUM: 8.4 mg/dL — AB (ref 8.9–10.3)
CO2: 32 mmol/L (ref 22–32)
Chloride: 96 mmol/L — ABNORMAL LOW (ref 101–111)
Creatinine, Ser: 4.4 mg/dL — ABNORMAL HIGH (ref 0.44–1.00)
GFR calc Af Amer: 11 mL/min — ABNORMAL LOW (ref >60)
GFR calc non Af Amer: 10 mL/min — ABNORMAL LOW (ref >60)
GLUCOSE: 98 mg/dL (ref 65–99)
Potassium: 4.4 mmol/L (ref 3.5–5.1)
Sodium: 137 mmol/L (ref 135–145)

## 2016-10-08 LAB — GLUCOSE, CAPILLARY: Glucose-Capillary: 99 mg/dL (ref 65–99)

## 2016-10-08 MED ORDER — ACETAMINOPHEN 325 MG PO TABS: 650.0000 mg | ORAL_TABLET | Freq: Four times a day (QID) | ORAL | Status: AC | PRN

## 2016-10-08 MED ORDER — CINACALCET HCL 30 MG PO TABS: 30.0000 mg | ORAL_TABLET | Freq: Every day | ORAL | 0 refills | Status: AC

## 2016-10-08 MED ORDER — OXYCODONE HCL 5 MG PO CAPS: 5.0000 mg | ORAL_CAPSULE | Freq: Three times a day (TID) | ORAL | 0 refills | Status: AC

## 2016-10-08 MED ORDER — ALBUTEROL SULFATE (5 MG/ML) 0.5% IN NEBU: 2.5000 mg | INHALATION_SOLUTION | Freq: Four times a day (QID) | RESPIRATORY_TRACT | 0 refills | Status: AC | PRN

## 2016-10-08 MED ORDER — RENA-VITE PO TABS: 1.0000 | ORAL_TABLET | Freq: Every day | ORAL | 0 refills | Status: AC

## 2016-10-08 MED ORDER — EPOETIN ALFA 10000 UNIT/ML IJ SOLN
10000.0000 [IU] | INTRAMUSCULAR | Status: DC
  Administered 2016-10-08: 10000 [IU] via INTRAVENOUS
  Filled 2016-10-08: qty 1

## 2016-10-08 MED ORDER — IPRATROPIUM-ALBUTEROL 0.5-2.5 (3) MG/3ML IN SOLN
3.0000 mL | Freq: Three times a day (TID) | RESPIRATORY_TRACT | Status: DC
  Administered 2016-10-08: 3 mL via RESPIRATORY_TRACT
  Filled 2016-10-08: qty 3

## 2016-10-08 MED ORDER — SERTRALINE HCL 100 MG PO TABS: 100.0000 mg | ORAL_TABLET | Freq: Every day | ORAL | 0 refills | Status: AC

## 2016-10-08 MED ORDER — DOCUSATE SODIUM 100 MG PO CAPS: 100.0000 mg | ORAL_CAPSULE | Freq: Every day | ORAL | 0 refills | Status: AC | PRN

## 2016-10-08 MED ORDER — LABETALOL HCL 200 MG PO TABS: 200.0000 mg | ORAL_TABLET | Freq: Two times a day (BID) | ORAL | 0 refills | Status: AC

## 2016-10-08 MED ORDER — AMOXICILLIN-POT CLAVULANATE 875-125 MG PO TABS: 1.0000 | ORAL_TABLET | Freq: Two times a day (BID) | ORAL | 0 refills | Status: AC

## 2016-10-08 MED ORDER — HYDRALAZINE HCL 25 MG PO TABS
25.0000 mg | ORAL_TABLET | Freq: Three times a day (TID) | ORAL | 0 refills | Status: AC
Start: 2016-10-08 — End: ?

## 2016-10-08 NOTE — Progress Notes (Signed)
[facility]  ROUNDING NOTE   Subjective:   Seen and examined on hemodialysis. Tolerating treatment well. UF goal of 1.5 litres  Echo with aortic stenosis    HEMODIALYSIS FLOWSHEET:  Blood Flow Rate (mL/min): 400 mL/min Arterial Pressure (mmHg): -140 mmHg Venous Pressure (mmHg): 170 mmHg Transmembrane Pressure (mmHg): 40 mmHg Ultrafiltration Rate (mL/min): 670 mL/min Dialysate Flow Rate (mL/min): 600 ml/min Conductivity: Machine : 13.9 Conductivity: Machine : 13.9 Dialysis Fluid Bolus: Normal Saline Bolus Amount (mL): 250 mL Dialysate Change: Other (comment) (3K) Intra-Hemodialysis Comments: 346. Resting    Objective:  Vital signs in last 24 hours:  Temp:  [97.9 F (36.6 C)-98.3 F (36.8 C)] 98.3 F (36.8 C) (12/08 0940) Pulse Rate:  [57-64] 57 (12/08 1015) Resp:  [16-26] 20 (12/08 1015) BP: (116-179)/(40-88) 162/52 (12/08 1015) SpO2:  [95 %-98 %] 96 % (12/08 1015) Weight:  [74.8 kg (164 lb 14.5 oz)-76.3 kg (168 lb 3.4 oz)] 76.3 kg (168 lb 3.4 oz) (12/08 0940)  Weight change: 6.761 kg (14 lb 14.5 oz) Filed Weights   10/07/16 0535 10/08/16 0442 10/08/16 0940  Weight: 75.3 kg (166 lb 0.1 oz) 74.8 kg (164 lb 14.5 oz) 76.3 kg (168 lb 3.4 oz)    Intake/Output: I/O last 3 completed shifts: In: 243 [P.O.:240; I.V.:3] Out: 2000 [Other:2000]   Intake/Output this shift:  No intake/output data recorded.  Physical Exam: General: NAD,   Head: Normocephalic, atraumatic. Moist oral mucosal membranes  Eyes: Anicteric, PERRL  Neck: Supple, trachea midline  Lungs:  Clear to auscultation  Heart: Regular rate and rhythm  Abdomen:  Soft, nontender,   Extremities: trace peripheral edema.  Neurologic: Nonfocal, moving all four extremities  Skin: No lesions  Access: Left AVF annuerysmal    Basic Metabolic Panel:  Recent Labs Lab 10/06/16 1254 10/06/16 1744 10/07/16 0534 10/08/16 0457  NA 141 142 140 137  K 4.2 4.3 4.1 4.4  CL 100* 103 99* 96*  CO2 28  30 33* 32  GLUCOSE 87 109* 94 98  BUN 29* 29* 16 25*  CREATININE 5.35* 5.76*  5.57* 3.23* 4.40*  CALCIUM 8.8* 8.5* 8.6* 8.4*  PHOS  --  4.5  --   --     Liver Function Tests:  Recent Labs Lab 10/06/16 1254 10/06/16 1744 10/07/16 0534  AST 27  --  20  ALT 16  --  15  ALKPHOS 154*  --  136*  BILITOT 1.7*  --  1.1  PROT 7.4  --  6.7  ALBUMIN 3.9 3.4* 3.2*   No results for input(s): LIPASE, AMYLASE in the last 168 hours. No results for input(s): AMMONIA in the last 168 hours.  CBC:  Recent Labs Lab 10/06/16 1254 10/06/16 1744 10/07/16 0534 10/08/16 0457  WBC 3.7 3.6 4.0 4.1  NEUTROABS 3.0  --   --   --   HGB 9.9* 9.2* 8.4* 8.7*  HCT 29.7* 27.5* 25.0* 26.8*  MCV 99.5 98.4 98.7 99.5  PLT 121* 104* 97* 101*    Cardiac Enzymes:  Recent Labs Lab 10/06/16 1254 10/06/16 1744 10/06/16 2319 10/07/16 0534  TROPONINI <0.03 <0.03 <0.03 <0.03    BNP: Invalid input(s): POCBNP  CBG:  Recent Labs Lab 10/07/16 0736 10/08/16 0741  GLUCAP 86 99    Microbiology: No results found for this or any previous visit.  Coagulation Studies: No results for input(s): LABPROT, INR in the last 72 hours.  Urinalysis: No results for input(s): COLORURINE, LABSPEC, PHURINE, GLUCOSEU, HGBUR, BILIRUBINUR, KETONESUR, PROTEINUR, UROBILINOGEN, NITRITE, LEUKOCYTESUR  in the last 72 hours.  Invalid input(s): APPERANCEUR    Imaging: Ct Abdomen Pelvis Wo Contrast  Result Date: 10/08/2016 CLINICAL DATA:  65 year old female who is new to this area. Medical history reported as COPD, CHF, kidney failure. Patient reports cancer being surgically removed from 1 of her kidneys, unsure which. Shortness of breath. " Renal mass " . Initial encounter. EXAM: CT ABDOMEN AND PELVIS WITHOUT CONTRAST TECHNIQUE: Multidetector CT imaging of the abdomen and pelvis was performed following the standard protocol without IV contrast. COMPARISON:  AP view of the chest 10/06/2016. FINDINGS: Lower chest: Large  partially visible left pleural effusion. Associated left lower lung atelectasis and consolidation. Multiple punctate calcifications in the visible left lung parenchyma. Superimposed moderate size layering right pleural effusion. Visible right lower lung remarkable for a combination of small calcifications as well as a small 2-3 mm noncalcified pulmonary nodule on series 3, image 3. Patchy opacity in the right costophrenic angle with scattered calcifications most resembles atelectasis or scarring. Cardiomegaly.  No pericardial effusion. No upper abdominal free fluid. Hepatobiliary: Mildly nodular liver contour. No discrete liver lesion is evident in the absence of IV contrast. Surgical clips at the gallbladder fossa where there is also a trace amount of linear soft tissue or fluid, favor postoperative in nature. Pancreas: Negative. Spleen: Splenic volume estimated at 519 mL (normal splenic volume range 83 - 412 mL). No splenic lesion. Several splenules incidentally noted. Adrenals/Urinary Tract: Bilateral adrenal gland thickening compatible with adrenal hyperplasia. Bilateral renal atrophy, moderate to severe an greater on the left. Multiple mostly small exophytic mixed density cystic appearing lesions, but many demonstrate heterogeneous density and/or associated coarse calcification. There is a larger left renal upper pole 3.4 cm cyst with densitometry at the upper limits of simple fluid (19 Hounsfield units series 2, image 27). Superimposed surgical clips along the left renal hilum. No hydronephrosis. No hydroureter. Completely decompressed urinary bladder. Stomach/Bowel: Negative rectum and sigmoid colon. Negative left colon and transverse colon. Oral contrast has reached the cecum. Negative right colon, appendix (series 2, image 59) and terminal ileum. No dilated or abnormal small bowel loops. Negative stomach and duodenum. Vascular/Lymphatic: Diffuse severe calcified atherosclerosis throughout the aorta and all  visible Major all major arterial branches in the abdomen, pelvis, and visualized proximal lower extremities. Ventral abdominal venous collaterals, with evidence of a small recannulized paraumbilical vein. Unremarkable noncontrast appearance of the IVC. Vascular patency not evaluated in the absence of IV contrast. Mildly increased porta hepatis and gastrohepatic ligament lymph nodes. Similar upper limits of normal bilateral retroperitoneal lymph nodes at the renal level, individually up to 9 mm. Reproductive: Surgically absent uterus. Diminutive or absent ovaries. Other: No pelvic free fluid. Widespread flank and posterior subcutaneous edema. Musculoskeletal: Degenerative changes in the spine. Lumbar superior endplate Schmorl nodes. No acute or suspicious osseous lesion identified. IMPRESSION: 1. Partially visible large left pleural effusion with what appears to be a combination of left lower lobe collapse and consolidation. Moderate layering right pleural effusion. Scattered punctate calcifications in both lower lobes could reflect sequelae of prior granulomas disease or alveolar microlithiasis. 2. Cardiomegaly.  No pericardial effusion. 3. Advanced Calcified aortic atherosclerosis and calcified atherosclerosis of all major vascular structures, an appearance common in the setting of renal failure. 4. Bilateral renal atrophy with multiple mixed density cystic renal lesions. 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 The largest is a 3.4 cm left renal upper pole cyst with simple to slightly complex fluid density. There are superimposed surgical clips at  the left renal hilum. If further renal lesion evaluation is necessary recommend Abdomen MRI (preferably using renal mass protocol without and with contrast - feasibility of which could be discussed with a [facility] Specialty Radiologist by calling [phone]). 5. Suspect cirrhosis. Mild splenomegaly as well as abdominal wall venous collaterals. No  discrete liver lesion. 6. Adrenal hyperplasia. Electronically Signed   By: Odessa FlemingH  Hall M.D.   On: 10/08/2016 07:17   Dg Chest Port 1 View  Result Date: 10/06/2016 CLINICAL DATA:  Shortness of Breath EXAM: PORTABLE CHEST 1 VIEW COMPARISON:  None. FINDINGS: Cardiomegaly. Left lower lobe atelectasis or infiltrate with small left effusion. Right lung is clear. No acute bony abnormality. IMPRESSION: Left lower lobe atelectasis or pneumonia with small left effusion. Cardiomegaly. Electronically Signed   By: Charlett NoseKevin  Dover M.D.   On: 10/06/2016 13:36     Medications:    . cinacalcet  30 mg Oral Q breakfast  . docusate sodium  100 mg Oral BID  . epoetin (EPOGEN/PROCRIT) injection  10,000 Units Intravenous Q M,W,F-HD  . heparin  5,000 Units Subcutaneous Q8H  . hydrALAZINE  25 mg Oral Q8H  . ipratropium-albuterol  3 mL Nebulization TID  . labetalol  200 mg Oral BID  . multivitamin  1 tablet Oral Daily  . nitroGLYCERIN  0.5 inch Topical Q6H  . oxyCODONE  5 mg Oral Q8H  . sertraline  100 mg Oral QHS  . sodium chloride flush  3 mL Intravenous Q12H   sodium chloride, acetaminophen **OR** acetaminophen, albuterol, bisacodyl, HYDROcodone-acetaminophen, ondansetron **OR** ondansetron (ZOFRAN) IV, sodium chloride flush, traZODone  Assessment/ Plan:   Ms. Darrick PennaDeborah Myers is a 65 y.o. white female with end stage renal disease on hemodialysis since 2007, COPD, obesity, hypertension, congestive heart failure, coronary artery disease, obstructive sleep apnea, hyperlipidemia, lymphedema, who [facility] for end stage renal disease.   M[facility] [address]   1. End Stage Renal Disease: with pulmonary edema on admission.  Seen and examined on hemodialysis. Tolerating treatment well.   2. Hypertension: blood pressure elevated.  -  labetalol  3. Anemia of chronic kidney disease: hemoglobin 8.7 - EPO with treatment - mircera as outpatient.   4. Secondary  Hyperparathyroidism: PTH elevated at 868. Phosphorus and calcium at goal.  - not on a binder - cinacalcet   LOS: 0 Albaro Deviney 12/8/201710:29 AM

## 2016-10-08 NOTE — Care Management Obs Status (Signed)
MEDICARE OBSERVATION STATUS NOTIFICATION   Patient Details  Name: Sheri Myers MRN: [name] Date of Birth: 07-Jun-1951   Medicare Observation Status Notification Given:  No (attempted x3.  Patient receiving breathing treatment, off floor for CT, off floor for HD.  will Re attempt prior to discharge )    Chapman FitchBOWEN, Damontay Alred T, RN 10/08/2016, 11:44 AM

## 2016-10-08 NOTE — Progress Notes (Signed)
Pre dialysis  

## 2016-10-08 NOTE — Care Management Obs Status (Signed)
MEDICARE OBSERVATION STATUS NOTIFICATION   Patient Details  Name: Sheri Myers MRN: [id number] Date of Birth: 01/11/1951   Medicare Observation Status Notification Given:  Yes    Chapman FitchBOWEN, Genes[place] Caslin T, RN 10/08/2016, 2:01 PM

## 2016-10-08 NOTE — Progress Notes (Signed)
Pt. Does not want to be disturbed at night for a nebulizer treatment. The frequency for the nebs are changed to TID. If she needs one during HS, she has prn coverage.

## 2016-10-08 NOTE — Care Management (Signed)
Patient admitted for volume overload.  Patient recently moved to the area to live with her daughter.  [facility] [address] At baseline patient does not walk.  PT has assessed patient and does not recommended any follow up at discharge.  Patient's daughter and grandson transport to and from dialysis.  Patient has WC, BCS and shower chair available in the home.  Pending new patient appointment for Dr. Burnett ShengHedrick.  Susann Givensheryl Brawner notified of pending discharge.   At daughter's request I have provided a list of PCS services.  RNCM signing off

## 2016-10-08 NOTE — Progress Notes (Signed)
Pt A and O x 4. VSS. Pt tolerating diet well. No complaints of pain or nausea. IV removed intact, prescriptions given. Pt voiced understanding of discharge instructions with no further questions. Pt discharged via wheelchair with nurse tech.   

## 2016-10-08 NOTE — Progress Notes (Signed)
Pre Dialysis 

## 2016-10-08 NOTE — Discharge Instructions (Signed)
follow-up with nephrology and continue hemodialysis as recommended by nephrology-Monday Wednesday and Friday   follow-up with primary care physician in a week

## 2016-10-08 NOTE — Discharge Summary (Addendum)
[facility] [place] [facility]   PATIENT NAME: Sheri Myers    MR#:  [id number]  DATE OF BIRTH:  [birth year]  DATE OF ADMISSION:  10/06/2016 ADMITTING PHYSICIAN: Delfino Lovett, MD  DATE OF DISCHARGE: 10/08/16 PRIMARY CARE PHYSICIAN: Jerl Mina, MD    ADMISSION DIAGNOSIS:  SOB (shortness of breath) [R06.02]  DISCHARGE DIAGNOSIS:  Active Problems:   Volume overload LLL Pneumonia  SECONDARY DIAGNOSIS:   Past Medical History:  Diagnosis Date  . Arthritis   . CHF (congestive heart failure) (HCC)   . COPD (chronic obstructive pulmonary disease) (HCC)   . Hyperlipemia   . Hypertension   . Kidney failure     HOSPITAL COURSE:  Sheri Myers  is a 65 y.o. female with a known history of CHF, COPD on 2 L home oxygen, end stage renal disease on hemodialysis since 2007, obesity, hypertension, congestive heart failure, coronary artery disease, obstructive sleep apnea, hyperlipidemia, lymphedema being admitted for volume overload requiring emergent HD. She went to dialysis where she is scheduled to have dialysis today, they elected to send her here instead of performing dialysis because she was so short of breath.  Patient is in the process of moving from [place]. She took care of her mother until she died and now she is moving with her daughter here. She does have dialysis here but is yet to see her primary care doctor which is scheduled for tomorrow. Patient has not been taking any of her medications for the last few days and she cannot recall the name of any of her medications. She states something about the move and it hard for her to take her meds. Patient does not have a fever or productive cough. She denies chest pain she is just very short of breath. Patient states she was on her oxygen all night and that it was functioning.complaints of some leg cramps. Please review history and physical for details Please review progress note for hospital  course   Brief hospital course   Sheri Myers a 65 y.o. white femalewith end stage renal disease on hemodialysis since 2007, COPD, obesity, hypertension, congestive heart failure, coronary artery disease, obstructive sleep apnea, hyperlipidemia, lymphedema, being admitted for emergent HD due to volume overload and missed HD  * Volume overload - Had an emergent hemodialysis 10/06/2016 and repeated on 10/08/2016 Clinically doing better Appreciate nephrology recommendations - yet to start her outpt M-W-F HD -Hepatitis B surface antigen and HCV are negative Echocardiogram with ejection fraction 55-65% CT abdomen with the left lower lobe consolidation and partial collapse with effusion- -Outpatient follow-up with nephrology  *Left lower lobe pneumonia with pleural effusion and consolidation Patient is reporting hemoptysis but refuses to stay in the hospital for further workup /IV  antibiotics. Requested to discharge with by mouth medications. Will discharge patient home with by mouth Augmentin She will get back to the hospital if it gets worse  * Uncontrolled HTN-improved - continue labetolol and add hydralazine for better BP control  * depression - continue zoloft  * Anemia of ESRD - monitor, stable -EPO during dialysis   DISCHARGE CONDITIONS:   FAIR  CONSULTS OBTAINED:     PROCEDURES  -HD  DRUG ALLERGIES:   Allergies  Allergen Reactions  . Shellfish Allergy Hives    DISCHARGE MEDICATIONS:   Current Discharge Medication List    START taking these medications   Details  acetaminophen (TYLENOL) 325 MG tablet Take 2 tablets (650 mg total) by mouth every  6 (six) hours as needed for mild pain (or Fever >/= 101).    amoxicillin-clavulanate (AUGMENTIN) 875-125 MG tablet Take 1 tablet by mouth 2 (two) times daily. Qty: 14 tablet, Refills: 0    docusate sodium (COLACE) 100 MG capsule Take 1 capsule (100 mg total) by mouth daily as needed for mild  constipation. Qty: 10 capsule, Refills: 0    hydrALAZINE (APRESOLINE) 25 MG tablet Take 1 tablet (25 mg total) by mouth every 8 (eight) hours. Qty: 90 tablet, Refills: 0      CONTINUE these medications which have CHANGED   Details  albuterol (PROVENTIL) (5 MG/ML) 0.5% nebulizer solution Take 0.5 mLs (2.5 mg total) by nebulization every 6 (six) hours as needed for wheezing or shortness of breath. Qty: 20 vial, Refills: 0    cinacalcet (SENSIPAR) 30 MG tablet Take 1 tablet (30 mg total) by mouth daily. Qty: 60 tablet, Refills: 0    labetalol (NORMODYNE) 200 MG tablet Take 1 tablet (200 mg total) by mouth 2 (two) times daily. Qty: 60 tablet, Refills: 0    multivitamin (RENA-VIT) TABS tablet Take 1 tablet by mouth daily. Refills: 0    oxycodone (OXY-IR) 5 MG capsule Take 1 capsule (5 mg total) by mouth every 8 (eight) hours. Qty: 15 capsule, Refills: 0    sertraline (ZOLOFT) 100 MG tablet Take 1 tablet (100 mg total) by mouth at bedtime. Qty: 30 tablet, Refills: 0      CONTINUE these medications which have NOT CHANGED   Details  albuterol-ipratropium (COMBIVENT) 18-103 MCG/ACT inhaler Inhale 1 puff into the lungs every 6 (six) hours.    ipratropium-albuterol (DUONEB) 0.5-2.5 (3) MG/3ML SOLN Take 3 mLs by nebulization every 8 (eight) hours.         DISCHARGE INSTRUCTIONS:   Follow-up with nephrology and continue hemodialysis as recommended by nephrology-Monday Wednesday and Friday   follow-up with primary care physician in a week  DIET:  Renal diet  DISCHARGE CONDITION:  Fair  ACTIVITY:  Activity as tolerated  OXYGEN:  Home Oxygen: Yes.     Oxygen Delivery: 2 liters/min via Patient connected to nasal cannula oxygen  DISCHARGE LOCATION:  home   If you experience worsening of your admission symptoms, develop shortness of breath, life threatening emergency, suicidal or homicidal thoughts you must seek medical attention immediately by calling 911 or calling your MD  immediately  if symptoms less severe.  You Must read complete instructions/literature along with all the possible adverse reactions/side effects for all the Medicines you take and that have been prescribed to you. Take any new Medicines after you have completely understood and accpet all the possible adverse reactions/side effects.   Please note  You were cared for by a hospitalist during your hospital stay. If you have any questions about your discharge medications or the care you received while you were in the hospital after you are discharged, you can call the unit and asked to speak with the hospitalist on call if the hospitalist that took care of you is not available. Once you are discharged, your primary care physician will handle any further medical issues. Please note that NO REFILLS for any discharge medications will be authorized once you are discharged, as it is imperative that you return to your primary care physician (or establish a relationship with a primary care physician if you do not have one) for your aftercare needs so that they can reassess your need for medications and monitor your lab values.  Today  Chief Complaint  Patient presents with  . Shortness of Breath   Patient has reported one episode of hemoptysis this a.m. Denies any other episodes. Refused to stay in the hospital for further workup Chest x-ray has revealed a left lower lobe pneumonia and CT abdomen has revealed left lower lobe collapse partially with pleural effusion and consolidation. We will discharge her home with by mouth antibiotics  ROS:  CONSTITUTIONAL: Denies fevers, chills. Denies any fatigue, weakness.  EYES: Denies blurry vision, double vision, eye pain. EARS, NOSE, THROAT: Denies tinnitus, ear pain, hearing loss. RESPIRATORY: reporting productive  cough, one episode of hemoptysis and denies  wheeze, shortness of breath.  CARDIOVASCULAR: Denies chest pain, palpitations, edema.   GASTROINTESTINAL: Denies nausea, vomiting, diarrhea, abdominal pain. Denies bright red blood per rectum. GENITOURINARY: Denies dysuria, hematuria. ENDOCRINE: Denies nocturia or thyroid problems. HEMATOLOGIC AND LYMPHATIC: Denies easy bruising or bleeding. SKIN: Denies rash or lesion. MUSCULOSKELETAL: Denies pain in neck, back, shoulder, knees, hips or arthritic symptoms.  NEUROLOGIC: Denies paralysis, paresthesias.  PSYCHIATRIC: Denies anxiety or depressive symptoms.   VITAL SIGNS:  Blood pressure (!) 158/50, pulse (!) 58, temperature 97.8 F (36.6 C), temperature source Oral, resp. rate 16, height 5\' 2"  (1.575 m), weight 75 kg (165 lb 5.5 oz), SpO2 97 %.  I/O:    Intake/Output Summary (Last 24 hours) at 10/08/16 1517 Last data filed at 10/08/16 1248  Gross per 24 hour  Intake              120 ml  Output             1500 ml  Net            -1380 ml    PHYSICAL EXAMINATION:  GENERAL:  65 y.o.-year-old patient lying in the bed with no acute distress.  EYES: Pupils equal, round, reactive to light and accommodation. No scleral icterus. Extraocular muscles intact.  HEENT: Head atraumatic, normocephalic. Oropharynx and nasopharynx clear.  NECK:  Supple, no jugular venous distention. No thyroid enlargement, no tenderness.  LUNGS: Diminished breath sounds left lower lobe, moderate  breath sounds bilaterally except left lower lobe , no wheezing, rales,rhonchi or crepitation. No use of accessory muscles of respiration.  CARDIOVASCULAR: S1, S2 normal. No murmurs, rubs, or gallops.  ABDOMEN: Soft, non-tender, non-distended. Bowel sounds present. No organomegaly or mass.  EXTREMITIES: No pedal edema, cyanosis, or clubbing.  NEUROLOGIC: Cranial nerves II through XII are intact. Muscle strength 5/5 in all extremities. Sensation intact. Gait not checked.  PSYCHIATRIC: The patient is alert and oriented x 3.  SKIN: No obvious rash, lesion, or ulcer.   DATA REVIEW:   CBC  Recent Labs Lab  10/08/16 0457  WBC 4.1  HGB 8.7*  HCT 26.8*  PLT 101*    Chemistries   Recent Labs Lab 10/07/16 0534 10/08/16 0457  NA 140 137  K 4.1 4.4  CL 99* 96*  CO2 33* 32  GLUCOSE 94 98  BUN 16 25*  CREATININE 3.23* 4.40*  CALCIUM 8.6* 8.4*  AST 20  --   ALT 15  --   ALKPHOS 136*  --   BILITOT 1.1  --     Cardiac Enzymes  Recent Labs Lab 10/07/16 0534  TROPONINI <0.03    Microbiology Results  No results found for this or any previous visit.  RADIOLOGY:  Ct Abdomen Pelvis Wo Contrast  Result Date: 10/08/2016 CLINICAL DATA:  65 year old female who is new to this area. Medical history reported as  COPD, CHF, kidney failure. Patient reports cancer being surgically removed from 1 of her kidneys, unsure which. Shortness of breath. " Renal mass " . Initial encounter. EXAM: CT ABDOMEN AND PELVIS WITHOUT CONTRAST TECHNIQUE: Multidetector CT imaging of the abdomen and pelvis was performed following the standard protocol without IV contrast. COMPARISON:  AP view of the chest 10/06/2016. FINDINGS: Lower chest: Large partially visible left pleural effusion. Associated left lower lung atelectasis and consolidation. Multiple punctate calcifications in the visible left lung parenchyma. Superimposed moderate size layering right pleural effusion. Visible right lower lung remarkable for a combination of small calcifications as well as a small 2-3 mm noncalcified pulmonary nodule on series 3, image 3. Patchy opacity in the right costophrenic angle with scattered calcifications most resembles atelectasis or scarring. Cardiomegaly.  No pericardial effusion. No upper abdominal free fluid. Hepatobiliary: Mildly nodular liver contour. No discrete liver lesion is evident in the absence of IV contrast. Surgical clips at the gallbladder fossa where there is also a trace amount of linear soft tissue or fluid, favor postoperative in nature. Pancreas: Negative. Spleen: Splenic volume estimated at 519 mL (normal  splenic volume range 83 - 412 mL). No splenic lesion. Several splenules incidentally noted. Adrenals/Urinary Tract: Bilateral adrenal gland thickening compatible with adrenal hyperplasia. Bilateral renal atrophy, moderate to severe an greater on the left. Multiple mostly small exophytic mixed density cystic appearing lesions, but many demonstrate heterogeneous density and/or associated coarse calcification. There is a larger left renal upper pole 3.4 cm cyst with densitometry at the upper limits of simple fluid (19 Hounsfield units series 2, image 27). Superimposed surgical clips along the left renal hilum. No hydronephrosis. No hydroureter. Completely decompressed urinary bladder. Stomach/Bowel: Negative rectum and sigmoid colon. Negative left colon and transverse colon. Oral contrast has reached the cecum. Negative right colon, appendix (series 2, image 59) and terminal ileum. No dilated or abnormal small bowel loops. Negative stomach and duodenum. Vascular/Lymphatic: Diffuse severe calcified atherosclerosis throughout the aorta and all visible Major all major arterial branches in the abdomen, pelvis, and visualized proximal lower extremities. Ventral abdominal venous collaterals, with evidence of a small recannulized paraumbilical vein. Unremarkable noncontrast appearance of the IVC. Vascular patency not evaluated in the absence of IV contrast. Mildly increased porta hepatis and gastrohepatic ligament lymph nodes. Similar upper limits of normal bilateral retroperitoneal lymph nodes at the renal level, individually up to 9 mm. Reproductive: Surgically absent uterus. Diminutive or absent ovaries. Other: No pelvic free fluid. Widespread flank and posterior subcutaneous edema. Musculoskeletal: Degenerative changes in the spine. Lumbar superior endplate Schmorl nodes. No acute or suspicious osseous lesion identified. IMPRESSION: 1. Partially visible large left pleural effusion with what appears to be a combination of  left lower lobe collapse and consolidation. Moderate layering right pleural effusion. Scattered punctate calcifications in both lower lobes could reflect sequelae of prior granulomas disease or alveolar microlithiasis. 2. Cardiomegaly.  No pericardial effusion. 3. Advanced Calcified aortic atherosclerosis and calcified atherosclerosis of all major vascular structures, an appearance common in the setting of renal failure. 4. Bilateral renal atrophy with multiple mixed density cystic renal lesions. 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 The largest is a 3.4 cm left renal upper pole cyst with simple to slightly complex fluid density. There are superimposed surgical clips at the left renal hilum. If further renal lesion evaluation is necessary recommend Abdomen MRI (preferably using renal mass protocol without and with contrast - feasibility of which could be discussed with a [facility] Specialty Radiologist by calling  [phone]). 5. Suspect cirrhosis. Mild splenomegaly as well as abdominal wall venous collaterals. No discrete liver lesion. 6. Adrenal hyperplasia. Electronically Signed   By: Odessa Fleming M.D.   On: 10/08/2016 07:17   Dg Chest Port 1 View  Result Date: 10/06/2016 CLINICAL DATA:  Shortness of Breath EXAM: PORTABLE CHEST 1 VIEW COMPARISON:  None. FINDINGS: Cardiomegaly. Left lower lobe atelectasis or infiltrate with small left effusion. Right lung is clear. No acute bony abnormality. IMPRESSION: Left lower lobe atelectasis or pneumonia with small left effusion. Cardiomegaly. Electronically Signed   By: Charlett Nose M.D.   On: 10/06/2016 13:36    EKG:   Orders placed or performed during the hospital encounter of 10/06/16  . ED EKG  . ED EKG  . EKG 12-Lead  . EKG 12-Lead      Management plans discussed with the patient, family and they are in agreement.  CODE STATUS:     Code Status Orders        Start     Ordered   10/06/16 1712  Full code  Continuous     10/06/16  1711    Code Status History    Date Active Date Inactive Code Status Order ID Comments User Context   This patient has a current code status but no historical code status.    Advance Directive Documentation   Flowsheet Row Most Recent Value  Type of Advance Directive  Healthcare Power of Attorney  Pre-existing out of facility DNR order (yellow form or pink MOST form)  No data  "MOST" Form in Place?  No data      TOTAL TIME TAKING CARE OF THIS PATIENT: 45 minutes.   Note: This dictation was prepared with Dragon dictation along with smaller phrase technology. Any transcriptional errors that result from this process are unintentional.   @M[facility] @  on 10/08/2016 at 3:17 PM  Between 7am to 6pm - Pager - [phone]  After 6pm go to [email or web address] - password [email or web address] [facility]  [facility]  Office  [phone]  CC: Primary care physician; Jerl Mina, MD

## 2016-10-08 NOTE — Progress Notes (Signed)
The Nurse asked CH rounding the unit to visit with the Pt in Rm 206. CH visited the Pt who was in the Rm with a family member, [facility], Pt was having a breathing treatment, CH talked to the Pt's Nurse, then left with hope of visiting Pt later.    10/08/16 1600  Clinical Encounter Type  Visited With Patient;Patient and family together  Visit Type Initial;Spiritual support  Referral From Nurse  Consult/Referral To Chaplain  Spiritual Encounters  Spiritual Needs Prayer;Other (Comment)

## 2016-10-08 NOTE — Progress Notes (Signed)
HD completed without issue. 1.5 L goal met. Tolerated well  

## 2016-10-08 NOTE — Progress Notes (Signed)
Dialysis started 

## 2016-10-08 NOTE — Progress Notes (Signed)
Post HD assesment unchanged

## 2016-10-08 NOTE — Progress Notes (Signed)
PT Attempt Note  Patient Details Name: Darrick PennaDeborah Myers MRN: [id number] DOB: 06-22-1951   Cancelled Treatment:    Reason Eval/Treat Not Completed: Patient at procedure or test/unavailable. Attempted to see patient however she is currently out of room for dialysis. Will attempt on later date as pt is available and willing.  Sharalyn InkJason D Rosy Estabrook PT, DPT   Ellie Spickler 10/08/2016, 10:08 AM

## 2016-11-09 ENCOUNTER — Other Ambulatory Visit: Payer: Self-pay | Admitting: Specialist

## 2016-11-09 ENCOUNTER — Encounter (INDEPENDENT_AMBULATORY_CARE_PROVIDER_SITE_OTHER): Payer: Self-pay | Admitting: Orthopaedic Surgery

## 2016-11-09 ENCOUNTER — Ambulatory Visit (INDEPENDENT_AMBULATORY_CARE_PROVIDER_SITE_OTHER): Payer: Self-pay

## 2016-11-09 ENCOUNTER — Encounter (INDEPENDENT_AMBULATORY_CARE_PROVIDER_SITE_OTHER): Payer: Self-pay

## 2016-11-09 ENCOUNTER — Ambulatory Visit (INDEPENDENT_AMBULATORY_CARE_PROVIDER_SITE_OTHER): Payer: Medicare Other | Admitting: Orthopaedic Surgery

## 2016-11-09 ENCOUNTER — Ambulatory Visit (INDEPENDENT_AMBULATORY_CARE_PROVIDER_SITE_OTHER): Payer: Medicare Other

## 2016-11-09 DIAGNOSIS — M25562 Pain in left knee: Secondary | ICD-10-CM | POA: Diagnosis not present

## 2016-11-09 DIAGNOSIS — R918 Other nonspecific abnormal finding of lung field: Secondary | ICD-10-CM

## 2016-11-09 DIAGNOSIS — R0602 Shortness of breath: Secondary | ICD-10-CM

## 2016-11-09 DIAGNOSIS — G8929 Other chronic pain: Secondary | ICD-10-CM | POA: Diagnosis not present

## 2016-11-09 DIAGNOSIS — J90 Pleural effusion, not elsewhere classified: Secondary | ICD-10-CM

## 2016-11-09 DIAGNOSIS — M25561 Pain in right knee: Secondary | ICD-10-CM

## 2016-11-09 NOTE — Progress Notes (Signed)
Office Visit Note   Patient: Sheri Myers           Date of Birth: 1951-04-25           MRN: [id number] Visit Date: 11/09/2016              Requested by: Jerl Mina, MD [address] [facility] [place], [place] [address] PCP: Jerl Mina, MD   Assessment & Plan: Visit Diagnoses:  1. 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 Patient is a very poor surgical candidate with history of complications from left knee replacement. She has not ambulated in 10 years would not he is suitable for any additional surgeries. At this point I encouraged her to pursue the pain clinic for chronic pain management. I do not recommend any invasive procedures including injections specialty into the prosthetic knee. She has not had any significant relief from cortisone injections into the right knee.  Follow-Up Instructions: No Follow-up on file.   Orders:  Orders Placed This Encounter  Procedures  . XR KNEE 3 VIEW RIGHT  . XR KNEE 3 VIEW LEFT   No orders of the defined types were placed in this encounter.     Procedures: No procedures performed   Clinical Data: No additional findings.   Subjective: Chief Complaint  Patient presents with  . Right Knee - Pain  . 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 She has chronic COPD on home oxygen with congestive heart failure and end-stage renal disease. She has not ambulated in 10 years. The pain is constant and worse with activity and weightbearing. She ambulates minimally. She did have multiple infections of the left knee replacement that was treated with long-term antibiotics. She denies any constitutional symptoms or any worsening from her chronic baseline pain. She is in the process of getting into a pain clinic. Complete review  of systems is negative except for history of present illness    Review of Systems   Objective: Vital Signs: There were no vitals taken for this visit.  Physical Exam Well-developed nourished acute distress alert 3 nonlabored breathing normal judgments affect abdomen soft no lymphadenopathy Ortho Exam Exam of the left knee shows well-healed surgical scar. She has pain throughout her knee with palpation. There is no signs of infection. Exam of the right knee shows very limited range of motion with severe crepitus. Specialty Comments:  No specialty comments available.  Imaging: Xr Knee 3 View Left  Result Date: 11/09/2016 Stable left total knee replacement without any apparent loosening or complications  Xr Knee 3 View Right  Result Date: 11/09/2016 Advanced end-stage degenerative joint disease of the right knee    PMFS History: Patient Active Problem List   Diagnosis Date Noted  . Volume overload 10/06/2016   Past Medical History:  Diagnosis Date  . Arthritis   . CHF (congestive heart failure) (HCC)   . COPD (chronic obstructive pulmonary disease) (HCC)   . Hyperlipemia   . Hypertension   . Kidney failure     Family History  Problem Relation Age of Onset  . Diabetes Mother     Past Surgical History:  Procedure Laterality Date  . ABDOMINAL HYSTERECTOMY    . INSERTION OF DIALYSIS CATHETER    . REPLACEMENT TOTAL KNEE  Social History   Occupational History  . Not on file.   Social History Main Topics  . Smoking status: Former Smoker    Quit date: 10/06/1990  . Smokeless tobacco: Never Used  . Alcohol use No  . Drug use: Unknown  . Sexual activity: Not on file

## 2016-11-22 ENCOUNTER — Encounter: Payer: Self-pay | Admitting: Emergency Medicine

## 2016-11-22 ENCOUNTER — Emergency Department
Admission: EM | Admit: 2016-11-22 | Discharge: 2016-11-22 | Disposition: A | Discharge status: Home / Self Care | Payer: Medicare Other | Attending: Emergency Medicine | Admitting: Emergency Medicine

## 2016-11-22 DIAGNOSIS — R42 Dizziness and giddiness: Secondary | ICD-10-CM | POA: Diagnosis present

## 2016-11-22 DIAGNOSIS — J449 Chronic obstructive pulmonary disease, unspecified: Secondary | ICD-10-CM | POA: Diagnosis not present

## 2016-11-22 DIAGNOSIS — M25561 Pain in right knee: Secondary | ICD-10-CM | POA: Diagnosis not present

## 2016-11-22 DIAGNOSIS — I110 Hypertensive heart disease with heart failure: Secondary | ICD-10-CM | POA: Diagnosis not present

## 2016-11-22 DIAGNOSIS — Z87891 Personal history of nicotine dependence: Secondary | ICD-10-CM | POA: Diagnosis not present

## 2016-11-22 DIAGNOSIS — G8929 Other chronic pain: Secondary | ICD-10-CM | POA: Diagnosis not present

## 2016-11-22 DIAGNOSIS — I509 Heart failure, unspecified: Secondary | ICD-10-CM | POA: Diagnosis not present

## 2016-11-22 LAB — CBC
HEMATOCRIT: 25.6 % — AB (ref 35.0–47.0)
HEMOGLOBIN: 8.8 g/dL — AB (ref 12.0–16.0)
MCH: 33.6 pg (ref 26.0–34.0)
MCHC: 34.4 g/dL (ref 32.0–36.0)
MCV: 97.6 fL (ref 80.0–100.0)
Platelets: 124 10*3/uL — ABNORMAL LOW (ref 150–440)
RBC: 2.63 MIL/uL — ABNORMAL LOW (ref 3.80–5.20)
RDW: 17.6 % — AB (ref 11.5–14.5)
WBC: 4.4 10*3/uL (ref 3.6–11.0)

## 2016-11-22 LAB — BASIC METABOLIC PANEL
ANION GAP: 13 (ref 5–15)
BUN: 21 mg/dL — AB (ref 6–20)
CHLORIDE: 95 mmol/L — AB (ref 101–111)
CO2: 28 mmol/L (ref 22–32)
Calcium: 8.2 mg/dL — ABNORMAL LOW (ref 8.9–10.3)
Creatinine, Ser: 3.36 mg/dL — ABNORMAL HIGH (ref 0.44–1.00)
GFR calc Af Amer: 15 mL/min — ABNORMAL LOW (ref >60)
GFR, EST NON AFRICAN AMERICAN: 13 mL/min — AB (ref >60)
GLUCOSE: 100 mg/dL — AB (ref 65–99)
POTASSIUM: 3.8 mmol/L (ref 3.5–5.1)
Sodium: 136 mmol/L (ref 135–145)

## 2016-11-22 LAB — TROPONIN I: Troponin I: <0.03 ng/mL (ref <0.03)

## 2016-11-22 MED ORDER — OXYCODONE-ACETAMINOPHEN 5-325 MG PO TABS: 1.0000 | ORAL_TABLET | Freq: Four times a day (QID) | ORAL | 0 refills | Status: AC | PRN

## 2016-11-22 MED ORDER — OXYCODONE-ACETAMINOPHEN 5-325 MG PO TABS
1.0000 | ORAL_TABLET | Freq: Once | ORAL | Status: AC
  Administered 2016-11-22: 1 via ORAL
  Filled 2016-11-22: qty 1

## 2016-11-22 MED ORDER — HYDROMORPHONE HCL 1 MG/ML IJ SOLN
1.0000 mg | Freq: Once | INTRAMUSCULAR | Status: AC
  Administered 2016-11-22: 1 mg via INTRAMUSCULAR
  Filled 2016-11-22: qty 1

## 2016-11-22 NOTE — ED Provider Notes (Addendum)
[facility] Emergency Department Provider Note  Time seen: 9:39 PM  I have reviewed the triage vital signs and the nursing notes.   HISTORY  Chief Complaint Hypotension and Dizziness    HPI Sheri Myers is a 66 y.o. female with a past medical history of CHF, COPD, hypertension, hyperlipidemia, end-stage renal disease on hemodialysis, who presents to the emergency department for complaints of dizziness, hypotension, and right knee pain. According to the patient her blood pressure dropped: Dialysis. Patient states this is somewhat common for her. She became somewhat lightheaded and dizzy but she also states is fairly common for her. Patient is very complain the emergency department as severe right knee pain. In speaking with the patient and her daughter the patient has been experiencing chronic severe right knee pain due to significant osteoarthritis. Patient is wheelchair-bound due to the osteoarthritis. Daughter states they are trying to give into a pain management clinic since moving to [place] 2 months ago. Patient was previously in a pain management clinic for many years per daughter. Patient was taking oxycodone and OxyContin per daughter. Patient's primary care physician Dr. Burnett Sheng did order the patient 1 month worth of oxycodoneon 12:15. However daughter states for the past one week the patient has been completely out of medication and her pain management appointment is not until 12/13/16. They are attempting to get back into the primary care doctor's office but have so far been unsuccessful. Patient states she has not been able sleep for the past 3 days due to the pain in her right knee.  Past Medical History:  Diagnosis Date  . Arthritis   . CHF (congestive heart failure) (HCC)   . COPD (chronic obstructive pulmonary disease) (HCC)   . Hyperlipemia   . Hypertension   . Kidney failure     Patient Active Problem List   Diagnosis Date Noted  . Volume  overload 10/06/2016    Past Surgical History:  Procedure Laterality Date  . ABDOMINAL HYSTERECTOMY    . INSERTION OF DIALYSIS CATHETER    . REPLACEMENT TOTAL KNEE      Prior to Admission medications   Medication Sig Start Date End Date Taking? Authorizing Provider  acetaminophen (TYLENOL) 325 MG tablet Take 2 tablets (650 mg total) by mouth every 6 (six) hours as needed for mild pain (or Fever >/= 101). 10/08/16   Ramonita Lab, MD  albuterol (PROVENTIL) (5 MG/ML) 0.5% nebulizer solution Take 0.5 mLs (2.5 mg total) by nebulization every 6 (six) hours as needed for wheezing or shortness of breath. 10/08/16   Ramonita Lab, MD  albuterol-ipratropium (COMBIVENT) 18-103 MCG/ACT inhaler Inhale 1 puff into the lungs every 6 (six) hours.    Historical Provider, MD  cinacalcet (SENSIPAR) 30 MG tablet Take 1 tablet (30 mg total) by mouth daily. 10/08/16   Ramonita Lab, MD  docusate sodium (COLACE) 100 MG capsule Take 1 capsule (100 mg total) by mouth daily as needed for mild constipation. 10/08/16   Ramonita Lab, MD  fluticasone furoate-vilanterol (BREO ELLIPTA) 100-25 MCG/INH AEPB Inhale into the lungs. 11/09/16   Historical Provider, MD  hydrALAZINE (APRESOLINE) 25 MG tablet Take 1 tablet (25 mg total) by mouth every 8 (eight) hours. 10/08/16   Ramonita Lab, MD  ipratropium-albuterol (DUONEB) 0.5-2.5 (3) MG/3ML SOLN Take 3 mLs by nebulization every 8 (eight) hours.    Historical Provider, MD  labetalol (NORMODYNE) 200 MG tablet Take 1 tablet (200 mg total) by mouth 2 (two) times daily. 10/08/16   Deanna Artis  Gouru, MD  multivitamin (RENA-VIT) TABS tablet Take 1 tablet by mouth daily. 10/08/16   Ramonita Lab, MD  oxycodone (OXY-IR) 5 MG capsule Take 1 capsule (5 mg total) by mouth every 8 (eight) hours. 10/08/16   Ramonita Lab, MD  sertraline (ZOLOFT) 100 MG tablet Take 1 tablet (100 mg total) by mouth at bedtime. 10/08/16   Ramonita Lab, MD    Allergies  Allergen Reactions  . Shellfish Allergy Hives    Family History   Problem Relation Age of Onset  . Diabetes Mother     Social History Social History  Substance Use Topics  . Smoking status: Former Smoker    Quit date: 10/06/1990  . Smokeless tobacco: Never Used  . Alcohol use No    Review of Systems Constitutional: Negative for fever. Cardiovascular: Negative for chest pain. Respiratory: Negative for shortness of breath. Gastrointestinal: Negative for abdominal pain Neurological: Negative for headache 10-point ROS otherwise negative.  ____________________________________________   PHYSICAL EXAM:  VITAL SIGNS: ED Triage Vitals  Enc Vitals Group     BP 11/22/16 1736 (!) 117/44     Pulse Rate 11/22/16 1736 (!) 52     Resp 11/22/16 1736 16     Temp 11/22/16 1736 97.7 F (36.5 C)     Temp Source 11/22/16 1736 Oral     SpO2 11/22/16 1736 98 %     Weight 11/22/16 1736 150 lb (68 kg)     Height 11/22/16 1736 5\' 2"  (1.575 m)     Head Circumference --      Peak Flow --      Pain Score 11/22/16 1741 0     Pain Loc --      Pain Edu? --      Excl. in GC? --     Constitutional: Alert and oriented. Well appearing and in no distress. Eyes: Normal exam ENT   Head: Normocephalic and atraumatic.   Mouth/Throat: Mucous membranes are moist. Cardiovascular: Normal rate, regular rhythm. No murmur Respiratory: Normal respiratory effort without tachypnea nor retractions. Breath sounds are clear  Gastrointestinal: Soft and nontender. No distention.   Musculoskeletal: Right knee pain, tenderness especially with range of motion. Neurologic:  Normal speech and language. No gross focal neurologic deficits  Skin:  Skin is warm, dry and intact.  Psychiatric: Mood and affect are normal.  ____________________________________________  EKG reviewed and interpreted by myself shows what appears to be a sinus rhythm at 56 bpm. Narrow QRS, normal axis, normal intervals besides a slightly prolonged QTC of 524 ms. No concerning ST changes.   INITIAL  IMPRESSION / ASSESSMENT AND PLAN / ED COURSE  Pertinent labs & imaging results that were available during my care of the patient were reviewed by me and considered in my medical decision making (see chart for details).  Patient presents to the emergency department initially for low blood pressure and dizziness at dialysis. However once arrived at the emergency department the patient states the low blood pressure and dizziness is fairly common for her. It has completely resolved at this time. Patient's main concern is of severe right knee pain not being able to sleep for 3 nights that she has been out of pain medication for 1 week. I reviewed the patient's substance use database, the patient has been truthful in her description of pain medication and management by her primary care doctor. Patient's prescription by the primary care doctor would've expired one week ago when the patient states she ran out of pain medication.  Patient states she is trying to get back into the primary care doctor this week and has a pain management appointment 12/13/16. Overall the patient appears well. Labs although somewhat abnormal appeared to be at the patient's baseline. We will discharge with a short course of pain medication for the patient. She is to call her primary care doctor tomorrow. Patient and daughter are agreeable.  ____________________________________________   FINAL CLINICAL IMPRESSION(S) / ED DIAGNOSES  Right knee pain, chronic Hypotension, resolved Dizziness, resolved    Minna AntisKevin Siddhanth Denk, MD 11/22/16 [id number]    Minna AntisKevin Hamsini Verrilli, MD 11/22/16 2250

## 2016-11-22 NOTE — ED Triage Notes (Signed)
Pt arrived via EMS from [facility] for reports of low blood pressure. EMS reports pt dialysis reported systolic BP in the 80s before starting dialysis. EMS reports 103/38, sinus brady 55-56. Pt reports dizziness.

## 2016-11-22 NOTE — ED Triage Notes (Signed)
Arrives via EMS c/o dizziness, blurred vision x 1 day.  Also c/o nausea .  Patient has ESRD and had dialysis today.  While at dialysis blood pressure was low, SBP 80's.

## 2016-11-22 NOTE — Discharge Instructions (Signed)
Please follow-up with her primary care doctor says possible. Please call tomorrow to arrange an appointment. Return to the emergent department for any further episodes of low blood pressure, dizziness, or if he develop any other symptoms personally concerning to self. Otherwise please follow-up with her primary care doctor as well as pain management as as possible.

## 2016-11-22 NOTE — ED Notes (Addendum)
Pt brought to er from dialysis due to decreased BP at dialysis (80/31) - pt daughter states she has been tired/weak and short of breath for the last 4 days - pt c/o bilat knee pain but she has already seen her PCP and told that there is nothing they can do for the pain and referred her to pain clinic

## 2016-12-03 ENCOUNTER — Inpatient Hospital Stay
Admission: EM | Admit: 2016-12-03 | Discharge: 2016-12-30 | DRG: 291 | Disposition: E | Payer: Medicare Other | Attending: Internal Medicine | Admitting: Internal Medicine

## 2016-12-03 DIAGNOSIS — J9601 Acute respiratory failure with hypoxia: Secondary | ICD-10-CM | POA: Diagnosis present

## 2016-12-03 DIAGNOSIS — Y95 Nosocomial condition: Secondary | ICD-10-CM | POA: Diagnosis present

## 2016-12-03 DIAGNOSIS — N186 End stage renal disease: Secondary | ICD-10-CM | POA: Diagnosis present

## 2016-12-03 DIAGNOSIS — Z87891 Personal history of nicotine dependence: Secondary | ICD-10-CM

## 2016-12-03 DIAGNOSIS — J189 Pneumonia, unspecified organism: Secondary | ICD-10-CM

## 2016-12-03 DIAGNOSIS — R1011 Right upper quadrant pain: Secondary | ICD-10-CM | POA: Diagnosis present

## 2016-12-03 DIAGNOSIS — R0600 Dyspnea, unspecified: Secondary | ICD-10-CM

## 2016-12-03 DIAGNOSIS — D631 Anemia in chronic kidney disease: Secondary | ICD-10-CM | POA: Diagnosis present

## 2016-12-03 DIAGNOSIS — Z79899 Other long term (current) drug therapy: Secondary | ICD-10-CM

## 2016-12-03 DIAGNOSIS — J811 Chronic pulmonary edema: Secondary | ICD-10-CM | POA: Diagnosis present

## 2016-12-03 DIAGNOSIS — I4581 Long QT syndrome: Secondary | ICD-10-CM | POA: Diagnosis present

## 2016-12-03 DIAGNOSIS — E875 Hyperkalemia: Secondary | ICD-10-CM | POA: Diagnosis present

## 2016-12-03 DIAGNOSIS — R4020 Unspecified coma: Secondary | ICD-10-CM | POA: Diagnosis not present

## 2016-12-03 DIAGNOSIS — J9600 Acute respiratory failure, unspecified whether with hypoxia or hypercapnia: Secondary | ICD-10-CM

## 2016-12-03 DIAGNOSIS — R402434 Glasgow coma scale score 3-8, 24 hours or more after hospital admission: Secondary | ICD-10-CM | POA: Diagnosis present

## 2016-12-03 DIAGNOSIS — Z515 Encounter for palliative care: Secondary | ICD-10-CM | POA: Diagnosis present

## 2016-12-03 DIAGNOSIS — J9690 Respiratory failure, unspecified, unspecified whether with hypoxia or hypercapnia: Secondary | ICD-10-CM

## 2016-12-03 DIAGNOSIS — D696 Thrombocytopenia, unspecified: Secondary | ICD-10-CM | POA: Diagnosis present

## 2016-12-03 DIAGNOSIS — Z4659 Encounter for fitting and adjustment of other gastrointestinal appliance and device: Secondary | ICD-10-CM

## 2016-12-03 DIAGNOSIS — Z66 Do not resuscitate: Secondary | ICD-10-CM | POA: Diagnosis not present

## 2016-12-03 DIAGNOSIS — E785 Hyperlipidemia, unspecified: Secondary | ICD-10-CM | POA: Diagnosis present

## 2016-12-03 DIAGNOSIS — J810 Acute pulmonary edema: Secondary | ICD-10-CM

## 2016-12-03 DIAGNOSIS — J440 Chronic obstructive pulmonary disease with acute lower respiratory infection: Secondary | ICD-10-CM | POA: Diagnosis present

## 2016-12-03 DIAGNOSIS — R4189 Other symptoms and signs involving cognitive functions and awareness: Secondary | ICD-10-CM

## 2016-12-03 DIAGNOSIS — I350 Nonrheumatic aortic (valve) stenosis: Secondary | ICD-10-CM | POA: Diagnosis present

## 2016-12-03 DIAGNOSIS — I132 Hypertensive heart and chronic kidney disease with heart failure and with stage 5 chronic kidney disease, or end stage renal disease: Secondary | ICD-10-CM | POA: Diagnosis not present

## 2016-12-03 DIAGNOSIS — G931 Anoxic brain damage, not elsewhere classified: Secondary | ICD-10-CM | POA: Diagnosis not present

## 2016-12-03 DIAGNOSIS — Z01818 Encounter for other preprocedural examination: Secondary | ICD-10-CM

## 2016-12-03 DIAGNOSIS — I472 Ventricular tachycardia: Secondary | ICD-10-CM | POA: Diagnosis present

## 2016-12-03 DIAGNOSIS — Z96659 Presence of unspecified artificial knee joint: Secondary | ICD-10-CM | POA: Diagnosis present

## 2016-12-03 DIAGNOSIS — R739 Hyperglycemia, unspecified: Secondary | ICD-10-CM | POA: Diagnosis present

## 2016-12-03 DIAGNOSIS — R079 Chest pain, unspecified: Secondary | ICD-10-CM

## 2016-12-03 DIAGNOSIS — R570 Cardiogenic shock: Secondary | ICD-10-CM | POA: Diagnosis present

## 2016-12-03 DIAGNOSIS — Z886 Allergy status to analgesic agent status: Secondary | ICD-10-CM

## 2016-12-03 DIAGNOSIS — N2581 Secondary hyperparathyroidism of renal origin: Secondary | ICD-10-CM | POA: Diagnosis present

## 2016-12-03 DIAGNOSIS — I4891 Unspecified atrial fibrillation: Secondary | ICD-10-CM | POA: Diagnosis present

## 2016-12-03 DIAGNOSIS — I4892 Unspecified atrial flutter: Secondary | ICD-10-CM | POA: Diagnosis present

## 2016-12-03 DIAGNOSIS — J441 Chronic obstructive pulmonary disease with (acute) exacerbation: Secondary | ICD-10-CM

## 2016-12-03 DIAGNOSIS — G253 Myoclonus: Secondary | ICD-10-CM | POA: Diagnosis present

## 2016-12-03 DIAGNOSIS — R0602 Shortness of breath: Secondary | ICD-10-CM | POA: Diagnosis not present

## 2016-12-03 DIAGNOSIS — J159 Unspecified bacterial pneumonia: Secondary | ICD-10-CM | POA: Diagnosis present

## 2016-12-03 DIAGNOSIS — I4901 Ventricular fibrillation: Secondary | ICD-10-CM | POA: Diagnosis present

## 2016-12-03 DIAGNOSIS — I5033 Acute on chronic diastolic (congestive) heart failure: Secondary | ICD-10-CM | POA: Diagnosis present

## 2016-12-03 DIAGNOSIS — Z91013 Allergy to seafood: Secondary | ICD-10-CM

## 2016-12-03 DIAGNOSIS — Z992 Dependence on renal dialysis: Secondary | ICD-10-CM

## 2016-12-03 NOTE — ED Provider Notes (Signed)
[facility] Emergency Department Provider Note    First MD Initiated Contact with Patient 12/26/2016 2356     (approximate)  I have reviewed the triage vital signs and the nursing notes.   HISTORY  Chief Complaint Shortness of Breath and Chest Pain    HPI Sheri Myers is a 66 y.o. female with very complex past medical history presents with complaint of sudden onset chest pain and shortness of breath that started around 10:00 tonight while patient was sitting watching TV. Patient states that the pain is radiating to her left arm and into her neck. Pain is worse with movement. States she's also been having a very productive cough and was recently treated for viral upper respiratory infection by her primary care physician. She started on steroids and breathing treatments. Denies any fevers. States the pain is worsened by coughing. Also complains of right upper quadrant abdominal pain. No nausea or vomiting.   Past Medical History:  Diagnosis Date  . Arthritis   . CHF (congestive heart failure) (HCC)   . COPD (chronic obstructive pulmonary disease) (HCC)   . Hyperlipemia   . Hypertension   . Kidney failure    Family History  Problem Relation Age of Onset  . Diabetes Mother    Past Surgical History:  Procedure Laterality Date  . ABDOMINAL HYSTERECTOMY    . INSERTION OF DIALYSIS CATHETER    . REPLACEMENT TOTAL KNEE     Patient Active Problem List   Diagnosis Date Noted  . Pulmonary edema 12/04/2016  . Volume overload 10/06/2016      Prior to Admission medications   Medication Sig Start Date End Date Taking? Authorizing Provider  acetaminophen (TYLENOL) 325 MG tablet Take 2 tablets (650 mg total) by mouth every 6 (six) hours as needed for mild pain (or Fever >/= 101). 10/08/16  Yes Ramonita Lab, MD  cinacalcet (SENSIPAR) 30 MG tablet Take 1 tablet (30 mg total) by mouth daily. 10/08/16  Yes Ramonita Lab, MD  fluticasone furoate-vilanterol (BREO  ELLIPTA) 100-25 MCG/INH AEPB Inhale 1 puff into the lungs daily.  11/09/16  Yes Historical Provider, MD  hydrALAZINE (APRESOLINE) 25 MG tablet Take 1 tablet (25 mg total) by mouth every 8 (eight) hours. 10/08/16  Yes Ramonita Lab, MD  ipratropium-albuterol (DUONEB) 0.5-2.5 (3) MG/3ML SOLN Take 3 mLs by nebulization every 8 (eight) hours.   Yes Historical Provider, MD  labetalol (NORMODYNE) 200 MG tablet Take 1 tablet (200 mg total) by mouth 2 (two) times daily. 10/08/16  Yes Ramonita Lab, MD  multivitamin (RENA-VIT) TABS tablet Take 1 tablet by mouth daily. 10/08/16  Yes Ramonita Lab, MD  oxycodone (OXY-IR) 5 MG capsule Take 1 capsule (5 mg total) by mouth every 8 (eight) hours. 10/08/16  Yes Ramonita Lab, MD  oxyCODONE-acetaminophen (ROXICET) 5-325 MG tablet Take 1 tablet by mouth every 6 (six) hours as needed. 11/22/16  Yes Minna Antis, MD  sertraline (ZOLOFT) 100 MG tablet Take 1 tablet (100 mg total) by mouth at bedtime. 10/08/16  Yes Ramonita Lab, MD  albuterol (PROVENTIL) (5 MG/ML) 0.5% nebulizer solution Take 0.5 mLs (2.5 mg total) by nebulization every 6 (six) hours as needed for wheezing or shortness of breath. 10/08/16   Ramonita Lab, MD  albuterol-ipratropium (COMBIVENT) 18-103 MCG/ACT inhaler Inhale 1 puff into the lungs every 6 (six) hours.    Historical Provider, MD  docusate sodium (COLACE) 100 MG capsule Take 1 capsule (100 mg total) by mouth daily as needed for mild constipation. Patient  not taking: Reported on 12/04/2016 10/08/16   Ramonita LabAruna Gouru, MD    Allergies Asa [aspirin] and Shellfish allergy    Social History Social History  Substance Use Topics  . Smoking status: Former Smoker    Quit date: 10/06/1990  . Smokeless tobacco: Never Used  . Alcohol use No    Review of Systems Patient denies headaches, rhinorrhea, blurry vision, numbness, shortness of breath, chest pain, edema, cough, abdominal pain, nausea, vomiting, diarrhea, dysuria, fevers, rashes or hallucinations unless  otherwise stated above in HPI. ____________________________________________   PHYSICAL EXAM:  VITAL SIGNS: Vitals:   12/04/16 0230 12/04/16 0336  BP: 124/61 128/74  Pulse: (!) 56 (!) 58  Resp: 15 (!) 22  Temp:      Constitutional: Alert and oriented. ill appearing in moderate respiratory distress Eyes: Conjunctivae are normal. PERRL. EOMI. Head: Atraumatic. Nose: No congestion/rhinnorhea. Mouth/Throat: Mucous membranes are moist.  Oropharynx non-erythematous. Neck: No stridor. Painless ROM. No cervical spine tenderness to palpation Hematological/Lymphatic/Immunilogical: No cervical lymphadenopathy. Cardiovascular: Normal rate, regular rhythm. Grossly normal heart sounds.  Good peripheral circulation. Respiratory: tachypneic with + use of accessory muscles, diffuse wheezing throughout Gastrointestinal: Soft and nontender. No distention. No abdominal bruits. No CVA tenderness. Musculoskeletal: No lower extremity tenderness nor edema.  No joint effusions. Neurologic:  Normal speech and language. No gross focal neurologic deficits are appreciated. No gait instability. Skin:  Skin is warm, dry and intact. No rash noted. Psychiatric: Mood and affect are normal. Speech and behavior are normal.  ____________________________________________   LABS (all labs ordered are listed, but only abnormal results are displayed)  Results for orders placed or performed during the hospital encounter of 12/18/2016 (from the past 24 hour(s))  CBC with Differential/Platelet     Status: Abnormal   Collection Time: 12/04/16 12:11 AM  Result Value Ref Range   WBC 4.8 3.6 - 11.0 K/uL   RBC 2.87 (L) 3.80 - 5.20 MIL/uL   Hemoglobin 9.7 (L) 12.0 - 16.0 g/dL   HCT 78.228.4 (L) 95.635.0 - 21.347.0 %   MCV 98.7 80.0 - 100.0 fL   MCH 33.7 26.0 - 34.0 pg   MCHC 34.2 32.0 - 36.0 g/dL   RDW 08.618.4 (H) 57.811.5 - 46.914.5 %   Platelets 174 150 - 440 K/uL   Neutrophils Relative % 84 %   Neutro Abs 4.0 1.4 - 6.5 K/uL   Lymphocytes  Relative 11 %   Lymphs Abs 0.5 (L) 1.0 - 3.6 K/uL   Monocytes Relative 5 %   Monocytes Absolute 0.2 0.2 - 0.9 K/uL   Eosinophils Relative 0 %   Eosinophils Absolute 0.0 0 - 0.7 K/uL   Basophils Relative 0 %   Basophils Absolute 0.0 0 - 0.1 K/uL  Comprehensive metabolic panel     Status: Abnormal   Collection Time: 12/04/16 12:11 AM  Result Value Ref Range   Sodium 140 135 - 145 mmol/L   Potassium 3.8 3.5 - 5.1 mmol/L   Chloride 100 (L) 101 - 111 mmol/L   CO2 30 22 - 32 mmol/L   Glucose, Bld 124 (H) 65 - 99 mg/dL   BUN 18 6 - 20 mg/dL   Creatinine, Ser 6.292.80 (H) 0.44 - 1.00 mg/dL   Calcium 9.0 8.9 - 52.810.3 mg/dL   Total Protein 7.9 6.5 - 8.1 g/dL   Albumin 3.7 3.5 - 5.0 g/dL   AST 27 15 - 41 U/L   ALT 15 14 - 54 U/L   Alkaline Phosphatase 158 (H) 38 - 126  U/L   Total Bilirubin 0.8 0.3 - 1.2 mg/dL   GFR calc non Af Amer 17 (L) >60 mL/min   GFR calc Af Amer 19 (L) >60 mL/min   Anion gap 10 5 - 15  Troponin I     Status: None   Collection Time: 12/04/16 12:11 AM  Result Value Ref Range   Troponin I <0.03 <0.03 ng/mL  Magnesium     Status: None   Collection Time: 12/04/16 12:11 AM  Result Value Ref Range   Magnesium 2.0 1.7 - 2.4 mg/dL  Lipase, blood     Status: Abnormal   Collection Time: 12/04/16 12:11 AM  Result Value Ref Range   Lipase 59 (H) 11 - 51 U/L  Brain natriuretic peptide     Status: Abnormal   Collection Time: 12/04/16 12:11 AM  Result Value Ref Range   B Natriuretic Peptide 3,889.0 (H) 0.0 - 100.0 pg/mL  Influenza panel by PCR (type A & B)     Status: None   Collection Time: 12/04/16 12:47 AM  Result Value Ref Range   Influenza A By PCR NEGATIVE NEGATIVE   Influenza B By PCR NEGATIVE NEGATIVE  Troponin I     Status: None   Collection Time: 12/04/16  3:00 AM  Result Value Ref Range   Troponin I <0.03 <0.03 ng/mL   ____________________________________________  EKG My review and personal interpretation at Time: 00:00   Indication: chest pain  Rate: 65   Rhythm: sinus Axis: normal3 Other: wandering baseline, non specific st changes, no STEMI, ____________________________________________  RADIOLOGY  I personally reviewed all radiographic images ordered to evaluate for the above acute complaints and reviewed radiology reports and findings.  These findings were personally discussed with the patient.  Please see medical record for radiology report.  ____________________________________________   PROCEDURES  Procedure(s) performed:  Procedures    Critical Care performed: no ____________________________________________   INITIAL IMPRESSION / ASSESSMENT AND PLAN / ED COURSE  Pertinent labs & imaging results that were available during my care of the patient were reviewed by me and considered in my medical decision making (see chart for details).  DDX: Asthma, copd, CHF, pna, ptx, malignancy, Pe, anemia   Shantika Bermea is a 66 y.o. who presents to the ED with chief complaint of chest pain and shortness of breath that started roughly 2 hours prior to arrival. Patient afebrile and normal heart rate borderline hypertensive and no hypoxia. Patient is tachypnea can speaking in short phrases with diffuse wheezing concerning for COPD exacerbation. Based on her history I am concern for concomitant CHF as well as ACS. Also given her recent viral infection influenza and pneumonia remain on the differential. We'll provide symptomatically treatment.  Nebulizers.  The patient will be placed on continuous pulse oximetry and telemetry for monitoring.  Laboratory evaluation will be sent to evaluate for the above complaints.     Clinical Course as of Dec 04 345  Sat Dec 04, 2016  0225 Reviewed the laboratory results and CT imaging with the patient and family. Do suspect that alert portion of her symptoms is secondary to volume overload but the patient is not in any respiratory distress at this point. Her troponin is negative therefore feel this is less  consistent with ACS but do recognize that the patient is high risk. She is flu negative. Given her recent hospitalization and CT chest showing evidence of possible pneumonia we'll give single dose of antibiotics and obtain blood cultures. I did discuss this with  Dr. Herschel Sen[place] of hospitalist group who is in agreement with this plan. Given the patient's comorbidities a do feel the patient would benefit from admission to hospital for additional nebulizer treatments hemodynamic monitoring and reassessment in the morning.  Have discussed with the patient and available family all diagnostics and treatments performed thus far and all questions were answered to the best of my ability. The patient demonstrates understanding and agreement with plan.   [PR]    Clinical Course User Index [PR] Willy Eddy, MD     ____________________________________________   FINAL CLINICAL IMPRESSION(S) / ED DIAGNOSES  Final diagnoses:  Chest pain, unspecified type  Acute pulmonary edema (HCC)  COPD exacerbation (HCC)      NEW MEDICATIONS STARTED DURING THIS VISIT:  New Prescriptions   No medications on file     Note:  This document was prepared using Dragon voice recognition software and may include unintentional dictation errors.    Willy Eddy, MD 12/04/16 [phone]

## 2016-12-03 NOTE — ED Triage Notes (Signed)
Patient presents to Emergency Department via [facility] with complaints of suddn onset of CP and SOB.  Per EMS wheezing in all fields, EMS gave 5 mg albuterol, pt was receiving duoneb nebulizer at home.    Pt tachy at 30 RR, hx of COPD and dialysis, 2 lpm [place] full time at home.  PT denies dizziness, HA, N/V.

## 2016-12-04 ENCOUNTER — Emergency Department: Payer: Medicare Other

## 2016-12-04 ENCOUNTER — Encounter: Payer: Self-pay | Admitting: Emergency Medicine

## 2016-12-04 DIAGNOSIS — J811 Chronic pulmonary edema: Secondary | ICD-10-CM | POA: Diagnosis present

## 2016-12-04 DIAGNOSIS — D696 Thrombocytopenia, unspecified: Secondary | ICD-10-CM | POA: Diagnosis present

## 2016-12-04 DIAGNOSIS — I472 Ventricular tachycardia: Secondary | ICD-10-CM | POA: Diagnosis present

## 2016-12-04 DIAGNOSIS — R402434 Glasgow coma scale score 3-8, 24 hours or more after hospital admission: Secondary | ICD-10-CM | POA: Diagnosis present

## 2016-12-04 DIAGNOSIS — I350 Nonrheumatic aortic (valve) stenosis: Secondary | ICD-10-CM | POA: Diagnosis present

## 2016-12-04 DIAGNOSIS — I4892 Unspecified atrial flutter: Secondary | ICD-10-CM | POA: Diagnosis present

## 2016-12-04 DIAGNOSIS — R570 Cardiogenic shock: Secondary | ICD-10-CM | POA: Diagnosis present

## 2016-12-04 DIAGNOSIS — G253 Myoclonus: Secondary | ICD-10-CM | POA: Diagnosis not present

## 2016-12-04 DIAGNOSIS — Z66 Do not resuscitate: Secondary | ICD-10-CM | POA: Diagnosis not present

## 2016-12-04 DIAGNOSIS — J9601 Acute respiratory failure with hypoxia: Secondary | ICD-10-CM | POA: Diagnosis present

## 2016-12-04 DIAGNOSIS — I132 Hypertensive heart and chronic kidney disease with heart failure and with stage 5 chronic kidney disease, or end stage renal disease: Secondary | ICD-10-CM | POA: Diagnosis present

## 2016-12-04 DIAGNOSIS — G931 Anoxic brain damage, not elsewhere classified: Secondary | ICD-10-CM | POA: Diagnosis not present

## 2016-12-04 DIAGNOSIS — E785 Hyperlipidemia, unspecified: Secondary | ICD-10-CM | POA: Diagnosis present

## 2016-12-04 DIAGNOSIS — R4020 Unspecified coma: Secondary | ICD-10-CM | POA: Diagnosis not present

## 2016-12-04 DIAGNOSIS — Y95 Nosocomial condition: Secondary | ICD-10-CM | POA: Diagnosis present

## 2016-12-04 DIAGNOSIS — J440 Chronic obstructive pulmonary disease with acute lower respiratory infection: Secondary | ICD-10-CM | POA: Diagnosis present

## 2016-12-04 DIAGNOSIS — J441 Chronic obstructive pulmonary disease with (acute) exacerbation: Secondary | ICD-10-CM | POA: Diagnosis present

## 2016-12-04 DIAGNOSIS — I4901 Ventricular fibrillation: Secondary | ICD-10-CM | POA: Diagnosis present

## 2016-12-04 DIAGNOSIS — I5033 Acute on chronic diastolic (congestive) heart failure: Secondary | ICD-10-CM | POA: Diagnosis present

## 2016-12-04 DIAGNOSIS — R4189 Other symptoms and signs involving cognitive functions and awareness: Secondary | ICD-10-CM | POA: Diagnosis not present

## 2016-12-04 DIAGNOSIS — J810 Acute pulmonary edema: Secondary | ICD-10-CM | POA: Diagnosis not present

## 2016-12-04 DIAGNOSIS — N2581 Secondary hyperparathyroidism of renal origin: Secondary | ICD-10-CM | POA: Diagnosis present

## 2016-12-04 DIAGNOSIS — I4581 Long QT syndrome: Secondary | ICD-10-CM | POA: Diagnosis present

## 2016-12-04 DIAGNOSIS — N186 End stage renal disease: Secondary | ICD-10-CM | POA: Diagnosis present

## 2016-12-04 DIAGNOSIS — J159 Unspecified bacterial pneumonia: Secondary | ICD-10-CM | POA: Diagnosis present

## 2016-12-04 DIAGNOSIS — Z515 Encounter for palliative care: Secondary | ICD-10-CM | POA: Diagnosis present

## 2016-12-04 DIAGNOSIS — I4891 Unspecified atrial fibrillation: Secondary | ICD-10-CM | POA: Diagnosis present

## 2016-12-04 DIAGNOSIS — R0602 Shortness of breath: Secondary | ICD-10-CM | POA: Diagnosis present

## 2016-12-04 DIAGNOSIS — D631 Anemia in chronic kidney disease: Secondary | ICD-10-CM | POA: Diagnosis present

## 2016-12-04 LAB — COMPREHENSIVE METABOLIC PANEL
ALBUMIN: 3.7 g/dL (ref 3.5–5.0)
ALT: 15 U/L (ref 14–54)
ANION GAP: 10 (ref 5–15)
AST: 27 U/L (ref 15–41)
Alkaline Phosphatase: 158 U/L — ABNORMAL HIGH (ref 38–126)
BILIRUBIN TOTAL: 0.8 mg/dL (ref 0.3–1.2)
BUN: 18 mg/dL (ref 6–20)
CHLORIDE: 100 mmol/L — AB (ref 101–111)
CO2: 30 mmol/L (ref 22–32)
Calcium: 9 mg/dL (ref 8.9–10.3)
Creatinine, Ser: 2.8 mg/dL — ABNORMAL HIGH (ref 0.44–1.00)
GFR calc Af Amer: 19 mL/min — ABNORMAL LOW (ref >60)
GFR calc non Af Amer: 17 mL/min — ABNORMAL LOW (ref >60)
GLUCOSE: 124 mg/dL — AB (ref 65–99)
POTASSIUM: 3.8 mmol/L (ref 3.5–5.1)
Sodium: 140 mmol/L (ref 135–145)
TOTAL PROTEIN: 7.9 g/dL (ref 6.5–8.1)

## 2016-12-04 LAB — CBC
HCT: 26.7 % — ABNORMAL LOW (ref 35.0–47.0)
HEMOGLOBIN: 9 g/dL — AB (ref 12.0–16.0)
MCH: 33.1 pg (ref 26.0–34.0)
MCHC: 33.6 g/dL (ref 32.0–36.0)
MCV: 98.5 fL (ref 80.0–100.0)
PLATELETS: 143 10*3/uL — AB (ref 150–440)
RBC: 2.71 MIL/uL — ABNORMAL LOW (ref 3.80–5.20)
RDW: 18.4 % — AB (ref 11.5–14.5)
WBC: 8.7 10*3/uL (ref 3.6–11.0)

## 2016-12-04 LAB — BASIC METABOLIC PANEL
ANION GAP: 10 (ref 5–15)
BUN: 21 mg/dL — ABNORMAL HIGH (ref 6–20)
CALCIUM: 8.5 mg/dL — AB (ref 8.9–10.3)
CO2: 26 mmol/L (ref 22–32)
Chloride: 102 mmol/L (ref 101–111)
Creatinine, Ser: 3.18 mg/dL — ABNORMAL HIGH (ref 0.44–1.00)
GFR calc Af Amer: 16 mL/min — ABNORMAL LOW (ref >60)
GFR, EST NON AFRICAN AMERICAN: 14 mL/min — AB (ref >60)
GLUCOSE: 134 mg/dL — AB (ref 65–99)
Potassium: 4 mmol/L (ref 3.5–5.1)
Sodium: 138 mmol/L (ref 135–145)

## 2016-12-04 LAB — CBC WITH DIFFERENTIAL/PLATELET
Basophils Absolute: 0 10*3/uL (ref 0–0.1)
Basophils Relative: 0 %
Eosinophils Absolute: 0 10*3/uL (ref 0–0.7)
Eosinophils Relative: 0 %
HCT: 28.4 % — ABNORMAL LOW (ref 35.0–47.0)
Hemoglobin: 9.7 g/dL — ABNORMAL LOW (ref 12.0–16.0)
LYMPHS ABS: 0.5 10*3/uL — AB (ref 1.0–3.6)
Lymphocytes Relative: 11 %
MCH: 33.7 pg (ref 26.0–34.0)
MCHC: 34.2 g/dL (ref 32.0–36.0)
MCV: 98.7 fL (ref 80.0–100.0)
Monocytes Absolute: 0.2 10*3/uL (ref 0.2–0.9)
Monocytes Relative: 5 %
NEUTROS PCT: 84 %
Neutro Abs: 4 10*3/uL (ref 1.4–6.5)
Platelets: 174 10*3/uL (ref 150–440)
RBC: 2.87 MIL/uL — AB (ref 3.80–5.20)
RDW: 18.4 % — ABNORMAL HIGH (ref 11.5–14.5)
WBC: 4.8 10*3/uL (ref 3.6–11.0)

## 2016-12-04 LAB — PHOSPHORUS: PHOSPHORUS: 4.4 mg/dL (ref 2.5–4.6)

## 2016-12-04 LAB — INFLUENZA PANEL BY PCR (TYPE A & B)
INFLAPCR: NEGATIVE
INFLBPCR: NEGATIVE

## 2016-12-04 LAB — BRAIN NATRIURETIC PEPTIDE: B Natriuretic Peptide: 3889 pg/mL — ABNORMAL HIGH (ref 0.0–100.0)

## 2016-12-04 LAB — MAGNESIUM: Magnesium: 2 mg/dL (ref 1.7–2.4)

## 2016-12-04 LAB — LIPASE, BLOOD: LIPASE: 59 U/L — AB (ref 11–51)

## 2016-12-04 LAB — TROPONIN I: Troponin I: <0.03 ng/mL (ref <0.03)

## 2016-12-04 LAB — MRSA PCR SCREENING: MRSA BY PCR: NEGATIVE

## 2016-12-04 MED ORDER — HEPARIN SODIUM (PORCINE) 5000 UNIT/ML IJ SOLN
5000.0000 [IU] | Freq: Three times a day (TID) | INTRAMUSCULAR | Status: DC
  Administered 2016-12-04 – 2016-12-05 (×6): 5000 [IU] via SUBCUTANEOUS
  Filled 2016-12-04 (×6): qty 1

## 2016-12-04 MED ORDER — IPRATROPIUM-ALBUTEROL 0.5-2.5 (3) MG/3ML IN SOLN
3.0000 mL | Freq: Four times a day (QID) | RESPIRATORY_TRACT | Status: DC
  Administered 2016-12-04 – 2016-12-06 (×9): 3 mL via RESPIRATORY_TRACT
  Filled 2016-12-04 (×10): qty 3

## 2016-12-04 MED ORDER — FENTANYL CITRATE (PF) 100 MCG/2ML IJ SOLN
50.0000 ug | INTRAMUSCULAR | Status: DC | PRN
  Administered 2016-12-04 (×4): 50 ug via INTRAVENOUS
  Filled 2016-12-04 (×3): qty 2

## 2016-12-04 MED ORDER — IPRATROPIUM-ALBUTEROL 0.5-2.5 (3) MG/3ML IN SOLN: 3.0000 mL | Freq: Three times a day (TID) | RESPIRATORY_TRACT | Status: DC

## 2016-12-04 MED ORDER — IPRATROPIUM-ALBUTEROL 0.5-2.5 (3) MG/3ML IN SOLN
3.0000 mL | Freq: Once | RESPIRATORY_TRACT | Status: AC
  Administered 2016-12-04: 3 mL via RESPIRATORY_TRACT
  Filled 2016-12-04: qty 3

## 2016-12-04 MED ORDER — CEFEPIME-DEXTROSE 1 GM/50ML IV SOLR
1.0000 g | Freq: Every day | INTRAVENOUS | Status: DC
  Administered 2016-12-04 – 2016-12-05 (×2): 1 g via INTRAVENOUS
  Filled 2016-12-04 (×3): qty 50

## 2016-12-04 MED ORDER — CINACALCET HCL 30 MG PO TABS
30.0000 mg | ORAL_TABLET | Freq: Every day | ORAL | Status: DC
  Administered 2016-12-04 – 2016-12-05 (×2): 30 mg via ORAL
  Filled 2016-12-04 (×2): qty 1

## 2016-12-04 MED ORDER — SENNOSIDES-DOCUSATE SODIUM 8.6-50 MG PO TABS: 1.0000 | ORAL_TABLET | Freq: Every evening | ORAL | Status: DC | PRN

## 2016-12-04 MED ORDER — SODIUM CHLORIDE 0.9% FLUSH
3.0000 mL | Freq: Two times a day (BID) | INTRAVENOUS | Status: DC
  Administered 2016-12-04 – 2016-12-06 (×2): 3 mL via INTRAVENOUS

## 2016-12-04 MED ORDER — SODIUM CHLORIDE 0.9% FLUSH
3.0000 mL | Freq: Two times a day (BID) | INTRAVENOUS | Status: DC
  Administered 2016-12-04 – 2016-12-05 (×4): 3 mL via INTRAVENOUS

## 2016-12-04 MED ORDER — ALBUTEROL SULFATE (2.5 MG/3ML) 0.083% IN NEBU
5.0000 mg | INHALATION_SOLUTION | Freq: Once | RESPIRATORY_TRACT | Status: AC
  Administered 2016-12-04: 5 mg via RESPIRATORY_TRACT
  Filled 2016-12-04: qty 6

## 2016-12-04 MED ORDER — HYDRALAZINE HCL 25 MG PO TABS
25.0000 mg | ORAL_TABLET | Freq: Three times a day (TID) | ORAL | Status: DC
  Administered 2016-12-04 – 2016-12-05 (×2): 25 mg via ORAL
  Filled 2016-12-04 (×3): qty 1

## 2016-12-04 MED ORDER — IPRATROPIUM-ALBUTEROL 0.5-2.5 (3) MG/3ML IN SOLN: 3.0000 mL | Freq: Four times a day (QID) | RESPIRATORY_TRACT | Status: DC

## 2016-12-04 MED ORDER — ORAL CARE MOUTH RINSE
15.0000 mL | Freq: Two times a day (BID) | OROMUCOSAL | Status: DC
  Administered 2016-12-04 (×2): 15 mL via OROMUCOSAL

## 2016-12-04 MED ORDER — ACETAMINOPHEN 325 MG PO TABS: 650.0000 mg | ORAL_TABLET | Freq: Four times a day (QID) | ORAL | Status: DC | PRN

## 2016-12-04 MED ORDER — VANCOMYCIN HCL 500 MG IV SOLR
500.0000 mg | INTRAVENOUS | Status: DC
  Administered 2016-12-04: 500 mg via INTRAVENOUS
  Filled 2016-12-04: qty 500

## 2016-12-04 MED ORDER — VANCOMYCIN HCL 10 G IV SOLR
1250.0000 mg | Freq: Once | INTRAVENOUS | Status: AC
  Administered 2016-12-04: 1250 mg via INTRAVENOUS
  Filled 2016-12-04: qty 1250

## 2016-12-04 MED ORDER — FLUTICASONE FUROATE-VILANTEROL 100-25 MCG/INH IN AEPB
1.0000 | INHALATION_SPRAY | Freq: Every day | RESPIRATORY_TRACT | Status: DC
  Administered 2016-12-04 – 2016-12-05 (×2): 1 via RESPIRATORY_TRACT
  Filled 2016-12-04: qty 28

## 2016-12-04 MED ORDER — RENA-VITE PO TABS
1.0000 | ORAL_TABLET | Freq: Every day | ORAL | Status: DC
  Administered 2016-12-04 – 2016-12-05 (×2): 1 via ORAL
  Filled 2016-12-04 (×2): qty 1

## 2016-12-04 MED ORDER — SERTRALINE HCL 100 MG PO TABS
100.0000 mg | ORAL_TABLET | Freq: Every day | ORAL | Status: DC
  Administered 2016-12-04 – 2016-12-05 (×2): 100 mg via ORAL
  Filled 2016-12-04 (×2): qty 1

## 2016-12-04 MED ORDER — SODIUM CHLORIDE 0.9 % IV SOLN: 250.0000 mL | INTRAVENOUS | Status: DC | PRN

## 2016-12-04 MED ORDER — ALBUTEROL SULFATE (2.5 MG/3ML) 0.083% IN NEBU: 2.5000 mg | INHALATION_SOLUTION | Freq: Four times a day (QID) | RESPIRATORY_TRACT | Status: DC | PRN

## 2016-12-04 MED ORDER — LABETALOL HCL 200 MG PO TABS
200.0000 mg | ORAL_TABLET | Freq: Two times a day (BID) | ORAL | Status: DC
  Administered 2016-12-04 – 2016-12-05 (×2): 200 mg via ORAL
  Filled 2016-12-04 (×2): qty 1

## 2016-12-04 MED ORDER — VANCOMYCIN HCL 500 MG IV SOLR: 500.0000 mg | INTRAVENOUS | Status: DC

## 2016-12-04 MED ORDER — OXYCODONE HCL 5 MG PO TABS
5.0000 mg | ORAL_TABLET | Freq: Three times a day (TID) | ORAL | Status: DC
  Administered 2016-12-04 – 2016-12-06 (×7): 5 mg via ORAL
  Filled 2016-12-04 (×7): qty 1

## 2016-12-04 MED ORDER — OXYCODONE-ACETAMINOPHEN 5-325 MG PO TABS
1.0000 | ORAL_TABLET | Freq: Four times a day (QID) | ORAL | Status: DC | PRN
  Administered 2016-12-04 – 2016-12-06 (×6): 1 via ORAL
  Filled 2016-12-04 (×6): qty 1

## 2016-12-04 MED ORDER — CEFEPIME-DEXTROSE 1 GM/50ML IV SOLR
1.0000 g | Freq: Once | INTRAVENOUS | Status: AC
  Administered 2016-12-04: 1 g via INTRAVENOUS
  Filled 2016-12-04: qty 50

## 2016-12-04 MED ORDER — SODIUM CHLORIDE 0.9% FLUSH: 3.0000 mL | INTRAVENOUS | Status: DC | PRN

## 2016-12-04 MED ORDER — GUAIFENESIN-DM 100-10 MG/5ML PO SYRP
5.0000 mL | ORAL_SOLUTION | ORAL | Status: DC | PRN
  Administered 2016-12-04 – 2016-12-05 (×2): 5 mL via ORAL
  Filled 2016-12-04 (×2): qty 5

## 2016-12-04 NOTE — Evaluation (Signed)
Clinical/Bedside Swallow Evaluation Patient Details  Name: Darrick PennaDeborah Carrender MRN: [id number] Date of Birth: 28-Nov-1950  Today's Date: 12/04/2016 Time: SLP Start Time (ACUTE ONLY): 1220 SLP Stop Time (ACUTE ONLY): 1310 SLP Time Calculation (min) (ACUTE ONLY): 50 min  Past Medical History:  Past Medical History:  Diagnosis Date  . Arthritis   . CHF (congestive heart failure) (HCC)   . COPD (chronic obstructive pulmonary disease) (HCC)   . Hyperlipemia   . Hypertension   . Kidney failure    Past Surgical History:  Past Surgical History:  Procedure Laterality Date  . ABDOMINAL HYSTERECTOMY    . INSERTION OF DIALYSIS CATHETER    . REPLACEMENT TOTAL KNEE     HPI:  Pt  is a 66 y.o. 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 Patient gets dialyzed on Monday Wednesday Friday. Yesterday she had dialysis and after that she felt short of breath and had some chest discomfort in the retrosternal area. Chest pain is a aching in nature 4 out of 10 and non adiating. Patient was evaluated in the emergency room; chest x-ray showed fluid overload. She recently was treated for pneumonia 1 month ago. No increased WBC count; chest x-ray revealed infiltrate and super imposed edema. Currently, pt c/o dry mouth and requested ice. She conversed verbally but d/t overall discomfort and back pain, she often panted and grunted. Pt encouraged to calm and breath easily; pt's breathing calmed after a few minutes. NSG giving pt medicine at present time. Noted pt had a tongue forward/protrusion at rest baseline.    Assessment / Plan / Recommendation Clinical Impression  Pt appears at reduced risk for aspriation when following general aspiration precautions - including sitting as upright and forward in bed as able to lessen risk for  aspiration. Pt consumed trials of thin liquids and purees (declined solids) w/ no overt s/s of aspiration noted; no oral phase deficits noted. Due to pt being edentulous, pt was encouraged to choose soft, broken down foods to eat and also encouraged to sit upright and forward in bed when drinking/eating. Pt stated agreement. ST services will be available for further assessment if any change in status; NSG to reconsult.     Aspiration Risk   (reduced following general aspiration precautions)    Diet Recommendation  Regular/Mech Soft diet w/ Meats cut/moistened foods; Thin liquids. General aspiration precautions including positioning upright and forward when drinking/eating.   Medication Administration: Whole meds with liquid (or given in a Puree for easier swallowing if needed)    Other  Recommendations Recommended Consults:  (Dietician) Oral Care Recommendations: Oral care BID;Staff/trained caregiver to provide oral care   Follow up Recommendations None      Frequency and Duration            Prognosis Prognosis for Safe Diet Advancement: Good      Swallow Study   General Date of Onset: Dec 28, 2016 HPI: Pt  is a 66 y.o. 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 Patient gets dialyzed on Monday Wednesday Friday. Yesterday she had dialysis and after that she felt short of breath and had some chest discomfort in the retrosternal area. Chest pain is a aching in nature 4 out of 10 and  non adiating. Patient was evaluated in the emergency room; chest x-ray showed fluid overload. She recently was treated for pneumonia 1 month ago. No increased WBC count; chest x-ray revealed infiltrate and super imposed edema. Currently, pt c/o dry mouth and requested ice. She conversed verbally but d/t overall discomfort and back  pain, she often panted and grunted. Pt encouraged to calm and breath easily; pt's breathing calmed after a few minutes. NSG giving pt medicine at present time. Noted pt had a tongue forward/protrusion at rest baseline.  Type of Study: Bedside Swallow Evaluation Previous Swallow Assessment: none indicated Diet Prior to this Study: Regular;Thin liquids (per patient) Temperature Spikes Noted: No (wbc not elevated) Respiratory Status: Nasal cannula (2 liters) History of Recent Intubation: No Behavior/Cognition: Alert;Cooperative;Agitated (uncomfortable) Oral Cavity Assessment: Within Functional Limits Oral Care Completed by SLP: Recent completion by staff Oral Cavity - Dentition: Edentulous (baseline) Vision: Functional for self-feeding Self-Feeding Abilities: Able to feed self;Needs set up Patient Positioning: Postural control adequate for testing (pt did not want to sit fully forward in bed d/t back pain) Baseline Vocal Quality: Normal Volitional Cough: Strong Volitional Swallow: Able to elicit    Oral/Motor/Sensory Function Overall Oral Motor/Sensory Function: Within functional limits   Ice Chips Ice chips: Within functional limits Presentation: Self Fed;Spoon (pt took her own handful to eat as well)   Thin Liquid Thin Liquid: Within functional limits Presentation: Self Fed;Straw (~6 ozs )    Nectar Thick Nectar Thick Liquid: Not tested   Honey Thick Honey Thick Liquid: Not tested   Puree Puree: Within functional limits Presentation: Self Fed;Spoon (3 trials)   Solid   GO   Solid: Not tested Other Comments: pt declined         Jerilynn Som, MS, CCC-SLP Myalynn Lingle 12/04/2016,1:26 PM

## 2016-12-04 NOTE — Progress Notes (Signed)
Pre hd info 

## 2016-12-04 NOTE — ED Notes (Signed)
Patient transported to X-ray 

## 2016-12-04 NOTE — Progress Notes (Signed)
Pharmacy Antibiotic Note  Darrick PennaDeborah Dulski is a 66 y.o. female admitted on 12/05/2016 with PNA.  Pharmacy has been consulted for cefepime and vancomycin dosing.  Plan: 1. Cefepime 1 gm IV Q24H 2. Vancomycin 1.25 gm IV x 1 in ED followed by vancomycin 500 mg IV Q-dialysis. Pharmacy will continue to follow and adjust as needed to maintain trough 15 to 20 mcg/mL.   Height: 5\' 2"  (157.5 cm) Weight: 151 lb (68.5 kg) IBW/kg (Calculated) : 50.1  Temp (24hrs), Avg:97.9 F (36.6 C), Min:97.9 F (36.6 C), Max:97.9 F (36.6 C)   Recent Labs Lab 12/04/16 0011  WBC 4.8  CREATININE 2.80*    Estimated Creatinine Clearance: 17.9 mL/min (by C-G formula based on SCr of 2.8 mg/dL (H)).    Allergies  Allergen Reactions  . Asa [Aspirin] Other (See Comments)    Pt doesn't remember   . Shellfish Allergy Hives    Thank you for allowing pharmacy to be a part of this patient's care.  Carola FrostNathan A Tage Feggins, Pharm.D., BCPS Clinical Pharmacist 12/04/2016 2:42 AM

## 2016-12-04 NOTE — ED Notes (Signed)
Pt transport to room 248

## 2016-12-04 NOTE — Progress Notes (Signed)
  End of hd 

## 2016-12-04 NOTE — Consult Note (Signed)
[facility] Cardiology  CARDIOLOGY CONSULT NOTE  Patient ID: Aniylah Myers MRN: [id number] DOB/AGE: Mar 29, 1951 66 y.o.  Admit date: December 29, 2016 Referring Physician Elisabeth Pigeon Primary Physician [facility] Primary Cardiologist Fath Reason for Consultation Chest pain  HPI: 66 year old female referred for evaluation of chest pain. Patient has known history of essential hypertension, chronic diastolic congestive heart failure, COPD, aortic stenosis, and end-stage renal disease on chronic hemodialysis. The patient recently moved from [place] to [place], was recently hospitalized with pneumonia, has complained of persistent cough. She presents yesterday with pain, 4 out of 10, was intubated by cough and breathing, and nondiagnostic, troponin negative. Chest x-ray revealed mild pulmonary edema. The patient describes sharp, mid back, left shoulder pain unrelated to exertion.  Review of systems complete and found to be negative unless listed above     Past Medical History:  Diagnosis Date  . Arthritis   . CHF (congestive heart failure) (HCC)   . COPD (chronic obstructive pulmonary disease) (HCC)   . Hyperlipemia   . Hypertension   . Kidney failure     Past Surgical History:  Procedure Laterality Date  . ABDOMINAL HYSTERECTOMY    . INSERTION OF DIALYSIS CATHETER    . REPLACEMENT TOTAL KNEE      Prescriptions Prior to Admission  Medication Sig Dispense Refill Last Dose  . acetaminophen (TYLENOL) 325 MG tablet Take 2 tablets (650 mg total) by mouth every 6 (six) hours as needed for mild pain (or Fever >/= 101).   prn  . cinacalcet (SENSIPAR) 30 MG tablet Take 1 tablet (30 mg total) by mouth daily. 60 tablet 0 2016-12-29 at Unknown time  . fluticasone furoate-vilanterol (BREO ELLIPTA) 100-25 MCG/INH AEPB Inhale 1 puff into the lungs daily.    29-Dec-2016 at Unknown time  . hydrALAZINE (APRESOLINE) 25 MG tablet Take 1 tablet (25 mg total) by mouth every 8 (eight) hours. 90 tablet 0 12/29/2016 at Unknown time   . ipratropium-albuterol (DUONEB) 0.5-2.5 (3) MG/3ML SOLN Take 3 mLs by nebulization every 8 (eight) hours.   December 29, 2016 at Unknown time  . labetalol (NORMODYNE) 200 MG tablet Take 1 tablet (200 mg total) by mouth 2 (two) times daily. 60 tablet 0 12/29/16 at Unknown time  . multivitamin (RENA-VIT) TABS tablet Take 1 tablet by mouth daily.  0 12-29-2016 at Unknown time  . oxycodone (OXY-IR) 5 MG capsule Take 1 capsule (5 mg total) by mouth every 8 (eight) hours. 15 capsule 0 December 29, 2016 at Unknown time  . oxyCODONE-acetaminophen (ROXICET) 5-325 MG tablet Take 1 tablet by mouth every 6 (six) hours as needed. 15 tablet 0 prn  . sertraline (ZOLOFT) 100 MG tablet Take 1 tablet (100 mg total) by mouth at bedtime. 30 tablet 0 Past Week at Unknown time  . albuterol (PROVENTIL) (5 MG/ML) 0.5% nebulizer solution Take 0.5 mLs (2.5 mg total) by nebulization every 6 (six) hours as needed for wheezing or shortness of breath. 20 vial 0 prn  . albuterol-ipratropium (COMBIVENT) 18-103 MCG/ACT inhaler Inhale 1 puff into the lungs every 6 (six) hours.   Not Taking at Unknown time  . docusate sodium (COLACE) 100 MG capsule Take 1 capsule (100 mg total) by mouth daily as needed for mild constipation. (Patient not taking: Reported on 12/04/2016) 10 capsule 0 Not Taking at Unknown time   Social History   Social History  . Marital status: Single    Spouse name: N/A  . Number of children: N/A  . Years of education: N/A   Occupational History  . Not  on file.   Social History Main Topics  . Smoking status: Former Smoker    Quit date: 10/06/1990  . Smokeless tobacco: Never Used  . Alcohol use No  . Drug use: Unknown  . Sexual activity: Not on file   Other Topics Concern  . Not on file   Social History Narrative  . No narrative on file    Family History  Problem Relation Age of Onset  . Diabetes Mother       Review of systems complete and found to be negative unless listed above      PHYSICAL  EXAM  General: Well developed, well nourished, in no acute distress HEENT:  Normocephalic and atramatic Neck:  No JVD.  Lungs: Clear bilaterally to auscultation and percussion. Heart: HRRR . Normal S1 and S2 without gallops or murmurs.  Abdomen: Bowel sounds are positive, abdomen soft and non-tender  Msk:  Back normal, normal gait. Normal strength and tone for age. Extremities: No clubbing, cyanosis or edema.   Neuro: Alert and oriented X 3. Psych:  Good affect, responds appropriately  Labs:   Lab Results  Component Value Date   WBC 8.7 12/04/2016   HGB 9.0 (L) 12/04/2016   HCT 26.7 (L) 12/04/2016   MCV 98.5 12/04/2016   PLT 143 (L) 12/04/2016    Recent Labs Lab 12/04/16 0011 12/04/16 0526  NA 140 138  K 3.8 4.0  CL 100* 102  CO2 30 26  BUN 18 21*  CREATININE 2.80* 3.18*  CALCIUM 9.0 8.5*  PROT 7.9  --   BILITOT 0.8  --   ALKPHOS 158*  --   ALT 15  --   AST 27  --   GLUCOSE 124* 134*   Lab Results  Component Value Date   TROPONINI <0.03 12/04/2016   No results found for: CHOL No results found for: HDL No results found for: LDLCALC No results found for: TRIG No results found for: CHOLHDL No results found for: LDLDIRECT    Radiology: Dg Chest 2 View  Result Date: 12/04/2016 CLINICAL DATA:  Sudden onset of chest pain and dyspnea EXAM: CHEST  2 VIEW COMPARISON:  10/06/2016 FINDINGS: Unchanged moderate cardiomegaly. Mild vascular and interstitial prominence, worsened. New central airspace opacities. No pleural effusions. IMPRESSION: Vascular and interstitial prominence, as well as mild central airspace opacities. This may represent interstitial and alveolar edema. No dense focal consolidation. Electronically Signed   By: Ellery Plunkaniel R Mitchell M.D.   On: 12/04/2016 00:28   Ct Chest Wo Contrast  Result Date: 12/04/2016 CLINICAL DATA:  Acute onset of generalized chest pain and shortness of breath. Wheezing. Initial encounter. EXAM: CT CHEST WITHOUT CONTRAST TECHNIQUE:  Multidetector CT imaging of the chest was performed following the standard protocol without IV contrast. COMPARISON:  Chest radiograph performed earlier today at 12:22 a.m. FINDINGS: Cardiovascular: The heart is mildly enlarged. Calcification is noted at the aortic and mitral valves. Diffuse coronary artery calcifications are seen. Scattered calcification is noted along the thoracic aorta and proximal great vessels. Mediastinum/Nodes: Numerous enlarged mediastinal nodes are seen, measuring up to 2.0 cm in short axis at the subcarinal region. There is suggestion of underlying bilateral hilar lymphadenopathy, though this is not well assessed without contrast. Trace pericardial fluid remains within normal limits. The thyroid gland is unremarkable. No axillary lymphadenopathy is seen. Mildly prominent subcutaneous varices are seen along the left anterior chest wall. There is nonspecific mild skin thickening about the right areolar region. Lungs/Pleura: A small left pleural effusion is noted,  with patchy left basilar airspace opacity and underlying bilateral interstitial prominence. This raises concern for mild interstitial edema, though superimposed pneumonia cannot be excluded. Scattered calcified granulomata are noted at the right lung base. Upper Abdomen: The nodular contour of the liver is compatible with hepatic cirrhosis. The spleen is mildly enlarged, measuring 13.7 cm in length. Scattered calcification is seen along the proximal abdominal aorta and its branches. The patient is status post cholecystectomy, with clips noted at the gallbladder fossa. There is diffuse prominence of the adrenal glands, which may reflect adrenal hyperplasia. Chronic bilateral renal atrophy is partially characterized, with scattered calcifications at both kidneys, and postoperative change at the left kidney. Musculoskeletal: No acute osseous abnormalities are identified. Anterior bridging osteophytes are seen along the mid to lower  thoracic spine. The visualized musculature is unremarkable in appearance. IMPRESSION: 1. Small left pleural effusion, with patchy left basilar airspace opacity and underlying bilateral interstitial prominence. This raises concern for mild interstitial edema, though superimposed pneumonia cannot be excluded. 2. Numerous enlarged mediastinal nodes, measuring up to 2.0 cm in short axis. Suggestion of underlying bilateral hilar lymphadenopathy, though this is not well assessed without contrast. Metastatic disease or lymphoma cannot be excluded. Sarcoidosis might have a similar appearance. These would be amenable to biopsy, as deemed clinically appropriate. 3. Findings of hepatic cirrhosis.  Mild splenomegaly noted. 4. Mild cardiomegaly. Calcification at the aortic and mitral valves. Diffuse coronary artery calcifications seen. 5. Mildly prominent subcutaneous varices along the left anterior abdominal wall. 6. Nonspecific mild skin thickening about the right areolar region. Would correlate for any skin findings; this may remain within normal limits. 7. Scattered aortic atherosclerosis. 8. Diffuse prominence of the adrenal glands may reflect adrenal hyperplasia. 9. Chronic bilateral renal atrophy, with scattered calcifications at both kidneys, and postoperative change at the left kidney. Electronically Signed   By: Roanna Raider M.D.   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 Chest pain, with atypical features, pleuritic in nature, with unremarkable ECG, negative troponin, likely noncardiac 2. Acute on chronic diastolic CHF, patient currently does not appear to be fluid overloaded 3. COPD 4.  Recent history of pneumonia, with persistent nonproductive cough 5. Stage renal disease on chronic hemodialysis 6. Moderate severe aortic stenosis  Recommendations  1. Agree with current therapy 2. Defer full dose anticoagulation 3. Defer further cardiac workup at this time  Signed: Marcina Millard MD,PhD, [facility] 12/04/2016, 9:41 AM

## 2016-12-04 NOTE — Progress Notes (Signed)
[facility]  ROUNDING NOTE   Subjective:  Patient well known to [place] from prior admission. She presented with atypical chest pain per cardiology. She is also suspected as having possible pneumonia. She completed hemodialysis yesterday. Blood pressure actually found to be a bit low at 105/42. Pulse ox 100%.  Objective:  Vital signs in last 24 hours:  Temp:  [97.7 F (36.5 C)-99.1 F (37.3 C)] 97.7 F (36.5 C) (02/03 1420) Pulse Rate:  [51-66] 53 (02/03 1525) Resp:  [15-22] 18 (02/03 1525) BP: (83-181)/(40-92) 105/42 (02/03 1525) SpO2:  [95 %-100 %] 100 % (02/03 1525) Weight:  [68.5 kg (151 lb)-75.6 kg (166 lb 11.2 oz)] 75.6 kg (166 lb 10.7 oz) (02/03 1420)  Weight change:  Filed Weights   12/04/16 0002 12/04/16 0459 12/04/16 1420  Weight: 68.5 kg (151 lb) 75.6 kg (166 lb 11.2 oz) 75.6 kg (166 lb 10.7 oz)    Intake/Output: I/O last 3 completed shifts: In: 63 [IV Piggyback:50] Out: 0    Intake/Output this shift:  No intake/output data recorded.  Physical Exam: General: No acute distress  Head: Normocephalic, atraumatic. Moist oral mucosal membranes  Eyes: Anicteric  Neck: Supple, trachea midline  Lungs:  Clear to auscultation, normal effort  Heart: S1S2 no rubs  Abdomen:  Soft, nontender,   Extremities:  peripheral edema.  Neurologic: Nonfocal, moving all four extremities  Skin: No lesions  Access: LUE AVF    Basic Metabolic Panel:  Recent Labs Lab 12/04/16 0011 12/04/16 0526 12/04/16 1117  NA 140 138  --   K 3.8 4.0  --   CL 100* 102  --   CO2 30 26  --   GLUCOSE 124* 134*  --   BUN 18 21*  --   CREATININE 2.80* 3.18*  --   CALCIUM 9.0 8.5*  --   MG 2.0  --   --   PHOS  --   --  4.4    Liver Function Tests:  Recent Labs Lab 12/04/16 0011  AST 27  ALT 15  ALKPHOS 158*  BILITOT 0.8  PROT 7.9  ALBUMIN 3.7    Recent Labs Lab 12/04/16 0011  LIPASE 59*   No results for input(s): AMMONIA in the last 168  hours.  CBC:  Recent Labs Lab 12/04/16 0011 12/04/16 0526  WBC 4.8 8.7  NEUTROABS 4.0  --   HGB 9.7* 9.0*  HCT 28.4* 26.7*  MCV 98.7 98.5  PLT 174 143*    Cardiac Enzymes:  Recent Labs Lab 12/04/16 0011 12/04/16 0300 12/04/16 0526 12/04/16 1117  TROPONINI <0.03 <0.03 <0.03 <0.03    BNP: Invalid input(s): POCBNP  CBG: No results for input(s): GLUCAP in the last 168 hours.  Microbiology: Results for orders placed or performed during the hospital encounter of 12/11/2016  Blood culture (routine x 2)     Status: None (Preliminary result)   Collection Time: 12/04/16  2:50 AM  Result Value Ref Range Status   Specimen Description BLOOD RIGHT ANTECUBITAL  Final   Special Requests   Final    BOTTLES DRAWN AEROBIC AND ANAEROBIC AER6CC, ANA 6CC   Culture NO GROWTH < 12 HOURS  Final   Report Status PENDING  Incomplete  Blood culture (routine x 2)     Status: None (Preliminary result)   Collection Time: 12/04/16  3:00 AM  Result Value Ref Range Status   Specimen Description BLOOD LEFT ANTECUBITAL  Final   Special Requests   Final    BOTTLES DRAWN  AEROBIC AND ANAEROBIC AER 5CC, ANA 6CC   Culture NO GROWTH < 12 HOURS  Final   Report Status PENDING  Incomplete    Coagulation Studies: No results for input(s): LABPROT, INR in the last 72 hours.  Urinalysis: No results for input(s): COLORURINE, LABSPEC, PHURINE, GLUCOSEU, HGBUR, BILIRUBINUR, KETONESUR, PROTEINUR, UROBILINOGEN, NITRITE, LEUKOCYTESUR in the last 72 hours.  Invalid input(s): APPERANCEUR    Imaging: Dg Chest 2 View  Result Date: 12/04/2016 CLINICAL DATA:  Sudden onset of chest pain and dyspnea EXAM: CHEST  2 VIEW COMPARISON:  10/06/2016 FINDINGS: Unchanged moderate cardiomegaly. Mild vascular and interstitial prominence, worsened. New central airspace opacities. No pleural effusions. IMPRESSION: Vascular and interstitial prominence, as well as mild central airspace opacities. This may represent interstitial and  alveolar edema. No dense focal consolidation. Electronically Signed   By: Ellery Plunk M.D.   On: 12/04/2016 00:28   Ct Chest Wo Contrast  Result Date: 12/04/2016 CLINICAL DATA:  Acute onset of generalized chest pain and shortness of breath. Wheezing. Initial encounter. EXAM: CT CHEST WITHOUT CONTRAST TECHNIQUE: Multidetector CT imaging of the chest was performed following the standard protocol without IV contrast. COMPARISON:  Chest radiograph performed earlier today at 12:22 a.m. FINDINGS: Cardiovascular: The heart is mildly enlarged. Calcification is noted at the aortic and mitral valves. Diffuse coronary artery calcifications are seen. Scattered calcification is noted along the thoracic aorta and proximal great vessels. Mediastinum/Nodes: Numerous enlarged mediastinal nodes are seen, measuring up to 2.0 cm in short axis at the subcarinal region. There is suggestion of underlying bilateral hilar lymphadenopathy, though this is not well assessed without contrast. Trace pericardial fluid remains within normal limits. The thyroid gland is unremarkable. No axillary lymphadenopathy is seen. Mildly prominent subcutaneous varices are seen along the left anterior chest wall. There is nonspecific mild skin thickening about the right areolar region. Lungs/Pleura: A small left pleural effusion is noted, with patchy left basilar airspace opacity and underlying bilateral interstitial prominence. This raises concern for mild interstitial edema, though superimposed pneumonia cannot be excluded. Scattered calcified granulomata are noted at the right lung base. Upper Abdomen: The nodular contour of the liver is compatible with hepatic cirrhosis. The spleen is mildly enlarged, measuring 13.7 cm in length. Scattered calcification is seen along the proximal abdominal aorta and its branches. The patient is status post cholecystectomy, with clips noted at the gallbladder fossa. There is diffuse prominence of the adrenal  glands, which may reflect adrenal hyperplasia. Chronic bilateral renal atrophy is partially characterized, with scattered calcifications at both kidneys, and postoperative change at the left kidney. Musculoskeletal: No acute osseous abnormalities are identified. Anterior bridging osteophytes are seen along the mid to lower thoracic spine. The visualized musculature is unremarkable in appearance. IMPRESSION: 1. Small left pleural effusion, with patchy left basilar airspace opacity and underlying bilateral interstitial prominence. This raises concern for mild interstitial edema, though superimposed pneumonia cannot be excluded. 2. Numerous enlarged mediastinal nodes, measuring up to 2.0 cm in short axis. Suggestion of underlying bilateral hilar lymphadenopathy, though this is not well assessed without contrast. Metastatic disease or lymphoma cannot be excluded. Sarcoidosis might have a similar appearance. These would be amenable to biopsy, as deemed clinically appropriate. 3. Findings of hepatic cirrhosis.  Mild splenomegaly noted. 4. Mild cardiomegaly. Calcification at the aortic and mitral valves. Diffuse coronary artery calcifications seen. 5. Mildly prominent subcutaneous varices along the left anterior abdominal wall. 6. Nonspecific mild skin thickening about the right areolar region. Would correlate for any skin findings; this may  remain within normal limits. 7. Scattered aortic atherosclerosis. 8. Diffuse prominence of the adrenal glands may reflect adrenal hyperplasia. 9. Chronic bilateral renal atrophy, with scattered calcifications at both kidneys, and postoperative change at the left kidney. Electronically Signed   By: Roanna RaiderJeffery  Chang M.D.   On: 12/04/2016 01:43     Medications:    . ceFEPIme  1 g Intravenous Daily  . cinacalcet  30 mg Oral Q breakfast  . fluticasone furoate-vilanterol  1 puff Inhalation Daily  . heparin  5,000 Units Subcutaneous Q8H  . hydrALAZINE  25 mg Oral Q8H  .  ipratropium-albuterol  3 mL Inhalation Q6H  . labetalol  200 mg Oral BID  . mouth rinse  15 mL Mouth Rinse BID  . multivitamin  1 tablet Oral Daily  . oxyCODONE  5 mg Oral Q8H  . sertraline  100 mg Oral QHS  . sodium chloride flush  3 mL Intravenous Q12H  . sodium chloride flush  3 mL Intravenous Q12H  . vancomycin  500 mg Intravenous Q T,Th,Sa-HD   sodium chloride, acetaminophen, albuterol, fentaNYL (SUBLIMAZE) injection, guaiFENesin-dextromethorphan, oxyCODONE-acetaminophen, senna-docusate, sodium chloride flush  Assessment/ Plan:  [age] y.o. white femalewith end stage renal disease on hemodialysis since 2007, COPD, obesity, hypertension, congestive heart failure, coronary artery disease, obstructive sleep apnea, hyperlipidemia, lymphedema  [facility] [address]   1. ESRD on HD MWF. Patient completed hemodialysis yesterday. Currently O2 saturation is 100% and she appears to be breathing comfortably. There is the suggestion of interstitial prominence on the chest CT. However patient is a bit hypotensive as well. Therefore we will hold off on hemodialysis for now. Reassess for dialysis tomorrow.  2.  Anemia of chronic kidney disease. Hemoglobin currently 9.0. We will start the patient on Epogen 10,000 units IV with dialysis with next dialysis treatment.  3. Secondary hyperparathyroidism. Phosphorus currently under good control.  Continue Sensipar for now.  4.  Bacterial pneumonia.  Patient currently on cefepime and vancomycin. Management per hospitalist.  LOS: 0 Ember Henrikson 2/3/20183:55 PM

## 2016-12-04 NOTE — H&P (Addendum)
[facility] [place] at [facility]   PATIENT NAME: Sheri Myers    MR#:  [id number]  DATE OF BIRTH:  [birth year]  DATE OF ADMISSION:  12/21/2016  PRIMARY CARE PHYSICIAN: Jerl Mina, MD   REQUESTING/REFERRING PHYSICIAN:   CHIEF COMPLAINT:   Chief Complaint  Patient presents with  . Shortness of Breath  . Chest Pain    HISTORY OF PRESENT ILLNESS: Sheri Myers  is a 66 y.o. female with a known history of End-stage Renal disease is on dialysis, congestive heart failure diastolic, COPD, hypertension, hyperlipidemia, arthritis presented to the emergency room with shortness of breath and chest discomfort since one day. Patient gets dialyzed on Monday Wednesday Friday. Yesterday she had dialysis and after that she felt short of breath and had some chest discomfort in the retrosternal area. Chest pain is a aching in nature 4 out of 10 and non adiating. Patient was evaluated in the emergency room his chest x-ray showed fluid overload. She recently was treated for pneumonia 1 month ago. No WBC count in the blood during the workup and chest x-ray  revealed infiltrate and super imposed edema. Patient was given 1 dose of IV antibiotic vancomycin and cefepime in the emergency room and cultures were sent. First set of troponin was negative. Hospitalist service was consulted for further care of the patient.  PAST MEDICAL HISTORY:   Past Medical History:  Diagnosis Date  . Arthritis   . CHF (congestive heart failure) (HCC)   . COPD (chronic obstructive pulmonary disease) (HCC)   . Hyperlipemia   . Hypertension   . Kidney failure     PAST SURGICAL HISTORY: Past Surgical History:  Procedure Laterality Date  . ABDOMINAL HYSTERECTOMY    . INSERTION OF DIALYSIS CATHETER    . REPLACEMENT TOTAL KNEE      SOCIAL HISTORY:  Social History  Substance Use Topics  . Smoking status: Former Smoker    Quit date: 10/06/1990  . Smokeless tobacco: Never Used  . Alcohol use No     FAMILY HISTORY:  Family History  Problem Relation Age of Onset  . Diabetes Mother     DRUG ALLERGIES:  Allergies  Allergen Reactions  . Asa [Aspirin] Other (See Comments)    Pt doesn't remember   . Shellfish Allergy Hives    REVIEW OF SYSTEMS:   CONSTITUTIONAL: No fever, fatigue or weakness.  EYES: No blurred or double vision.  EARS, NOSE, AND THROAT: No tinnitus or ear pain.  RESPIRATORY: Has cough, shortness of breath,  No wheezing or hemoptysis.  CARDIOVASCULAR: No chest pain, orthopnea, edema.  GASTROINTESTINAL: No nausea, vomiting, diarrhea or abdominal pain.  GENITOURINARY: No dysuria, hematuria.  ENDOCRINE: No polyuria, nocturia,  HEMATOLOGY: No anemia, easy bruising or bleeding SKIN: No rash or lesion. MUSCULOSKELETAL: No joint pain or arthritis.   NEUROLOGIC: No tingling, numbness, weakness.  PSYCHIATRY: No anxiety or depression.   MEDICATIONS AT HOME:  Prior to Admission medications   Medication Sig Start Date End Date Taking? Authorizing Provider  acetaminophen (TYLENOL) 325 MG tablet Take 2 tablets (650 mg total) by mouth every 6 (six) hours as needed for mild pain (or Fever >/= 101). 10/08/16  Yes Ramonita Lab, MD  cinacalcet (SENSIPAR) 30 MG tablet Take 1 tablet (30 mg total) by mouth daily. 10/08/16  Yes Ramonita Lab, MD  fluticasone furoate-vilanterol (BREO ELLIPTA) 100-25 MCG/INH AEPB Inhale 1 puff into the lungs daily.  11/09/16  Yes Historical Provider, MD  hydrALAZINE (APRESOLINE) 25 MG tablet  Take 1 tablet (25 mg total) by mouth every 8 (eight) hours. 10/08/16  Yes Ramonita Lab, MD  ipratropium-albuterol (DUONEB) 0.5-2.5 (3) MG/3ML SOLN Take 3 mLs by nebulization every 8 (eight) hours.   Yes Historical Provider, MD  labetalol (NORMODYNE) 200 MG tablet Take 1 tablet (200 mg total) by mouth 2 (two) times daily. 10/08/16  Yes Ramonita Lab, MD  multivitamin (RENA-VIT) TABS tablet Take 1 tablet by mouth daily. 10/08/16  Yes Ramonita Lab, MD  oxycodone (OXY-IR) 5 MG  capsule Take 1 capsule (5 mg total) by mouth every 8 (eight) hours. 10/08/16  Yes Ramonita Lab, MD  oxyCODONE-acetaminophen (ROXICET) 5-325 MG tablet Take 1 tablet by mouth every 6 (six) hours as needed. 11/22/16  Yes Minna Antis, MD  sertraline (ZOLOFT) 100 MG tablet Take 1 tablet (100 mg total) by mouth at bedtime. 10/08/16  Yes Ramonita Lab, MD  albuterol (PROVENTIL) (5 MG/ML) 0.5% nebulizer solution Take 0.5 mLs (2.5 mg total) by nebulization every 6 (six) hours as needed for wheezing or shortness of breath. 10/08/16   Ramonita Lab, MD  albuterol-ipratropium (COMBIVENT) 18-103 MCG/ACT inhaler Inhale 1 puff into the lungs every 6 (six) hours.    Historical Provider, MD  docusate sodium (COLACE) 100 MG capsule Take 1 capsule (100 mg total) by mouth daily as needed for mild constipation. Patient not taking: Reported on 12/04/2016 10/08/16   Ramonita Lab, MD      PHYSICAL EXAMINATION:   VITAL SIGNS: Blood pressure 124/61, pulse (!) 56, temperature 97.9 F (36.6 C), temperature source Oral, resp. rate 15, height 5\' 2"  (1.575 m), weight 68.5 kg (151 lb), SpO2 98 %.  GENERAL:  66 y.o.-year-old patient lying in the bed with no acute distress.  EYES: Pupils equal, round, reactive to light and accommodation. No scleral icterus. Extraocular muscles intact.  HEENT: Head atraumatic, normocephalic. Oropharynx and nasopharynx clear.  NECK:  Supple, no jugular venous distention. No thyroid enlargement, no tenderness.  LUNGS: Decreased breath sounds bilaterally, bibasilar crepitations heard. No use of accessory muscles of respiration.  CARDIOVASCULAR: S1, S2 normal. No murmurs, rubs, or gallops.  ABDOMEN: Soft, nontender, nondistended. Bowel sounds present. No organomegaly or mass.  EXTREMITIES: No pedal edema, cyanosis, or clubbing.  NEUROLOGIC: Cranial nerves II through XII are intact. Muscle strength 5/5 in all extremities. Sensation intact. Gait not checked.  PSYCHIATRIC: The patient is alert and oriented  x 3.  SKIN: No obvious rash, lesion, or ulcer.   LABORATORY PANEL:   CBC  Recent Labs Lab 12/04/16 0011  WBC 4.8  HGB 9.7*  HCT 28.4*  PLT 174  MCV 98.7  MCH 33.7  MCHC 34.2  RDW 18.4*  LYMPHSABS 0.5*  MONOABS 0.2  EOSABS 0.0  BASOSABS 0.0   ------------------------------------------------------------------------------------------------------------------  Chemistries   Recent Labs Lab 12/04/16 0011  NA 140  K 3.8  CL 100*  CO2 30  GLUCOSE 124*  BUN 18  CREATININE 2.80*  CALCIUM 9.0  MG 2.0  AST 27  ALT 15  ALKPHOS 158*  BILITOT 0.8   ------------------------------------------------------------------------------------------------------------------ estimated creatinine clearance is 17.9 mL/min (by C-G formula based on SCr of 2.8 mg/dL (H)). ------------------------------------------------------------------------------------------------------------------ No results for input(s): TSH, T4TOTAL, T3FREE, THYROIDAB in the last 72 hours.  Invalid input(s): FREET3   Coagulation profile No results for input(s): INR, PROTIME in the last 168 hours. ------------------------------------------------------------------------------------------------------------------- No results for input(s): DDIMER in the last 72 hours. -------------------------------------------------------------------------------------------------------------------  Cardiac Enzymes  Recent Labs Lab 12/04/16 0011 12/04/16 0300  TROPONINI <0.03 <0.03   ------------------------------------------------------------------------------------------------------------------  Invalid input(s): POCBNP  ---------------------------------------------------------------------------------------------------------------  Urinalysis No results found for: COLORURINE, APPEARANCEUR, LABSPEC, PHURINE, GLUCOSEU, HGBUR, BILIRUBINUR, KETONESUR, PROTEINUR, UROBILINOGEN, NITRITE, LEUKOCYTESUR   RADIOLOGY: Dg Chest 2  View  Result Date: 12/04/2016 CLINICAL DATA:  Sudden onset of chest pain and dyspnea EXAM: CHEST  2 VIEW COMPARISON:  10/06/2016 FINDINGS: Unchanged moderate cardiomegaly. Mild vascular and interstitial prominence, worsened. New central airspace opacities. No pleural effusions. IMPRESSION: Vascular and interstitial prominence, as well as mild central airspace opacities. This may represent interstitial and alveolar edema. No dense focal consolidation. Electronically Signed   By: Ellery Plunkaniel R Mitchell M.D.   On: 12/04/2016 00:28   Ct Chest Wo Contrast  Result Date: 12/04/2016 CLINICAL DATA:  Acute onset of generalized chest pain and shortness of breath. Wheezing. Initial encounter. EXAM: CT CHEST WITHOUT CONTRAST TECHNIQUE: Multidetector CT imaging of the chest was performed following the standard protocol without IV contrast. COMPARISON:  Chest radiograph performed earlier today at 12:22 a.m. FINDINGS: Cardiovascular: The heart is mildly enlarged. Calcification is noted at the aortic and mitral valves. Diffuse coronary artery calcifications are seen. Scattered calcification is noted along the thoracic aorta and proximal great vessels. Mediastinum/Nodes: Numerous enlarged mediastinal nodes are seen, measuring up to 2.0 cm in short axis at the subcarinal region. There is suggestion of underlying bilateral hilar lymphadenopathy, though this is not well assessed without contrast. Trace pericardial fluid remains within normal limits. The thyroid gland is unremarkable. No axillary lymphadenopathy is seen. Mildly prominent subcutaneous varices are seen along the left anterior chest wall. There is nonspecific mild skin thickening about the right areolar region. Lungs/Pleura: A small left pleural effusion is noted, with patchy left basilar airspace opacity and underlying bilateral interstitial prominence. This raises concern for mild interstitial edema, though superimposed pneumonia cannot be excluded. Scattered calcified  granulomata are noted at the right lung base. Upper Abdomen: The nodular contour of the liver is compatible with hepatic cirrhosis. The spleen is mildly enlarged, measuring 13.7 cm in length. Scattered calcification is seen along the proximal abdominal aorta and its branches. The patient is status post cholecystectomy, with clips noted at the gallbladder fossa. There is diffuse prominence of the adrenal glands, which may reflect adrenal hyperplasia. Chronic bilateral renal atrophy is partially characterized, with scattered calcifications at both kidneys, and postoperative change at the left kidney. Musculoskeletal: No acute osseous abnormalities are identified. Anterior bridging osteophytes are seen along the mid to lower thoracic spine. The visualized musculature is unremarkable in appearance. IMPRESSION: 1. Small left pleural effusion, with patchy left basilar airspace opacity and underlying bilateral interstitial prominence. This raises concern for mild interstitial edema, though superimposed pneumonia cannot be excluded. 2. Numerous enlarged mediastinal nodes, measuring up to 2.0 cm in short axis. Suggestion of underlying bilateral hilar lymphadenopathy, though this is not well assessed without contrast. Metastatic disease or lymphoma cannot be excluded. Sarcoidosis might have a similar appearance. These would be amenable to biopsy, as deemed clinically appropriate. 3. Findings of hepatic cirrhosis.  Mild splenomegaly noted. 4. Mild cardiomegaly. Calcification at the aortic and mitral valves. Diffuse coronary artery calcifications seen. 5. Mildly prominent subcutaneous varices along the left anterior abdominal wall. 6. Nonspecific mild skin thickening about the right areolar region. Would correlate for any skin findings; this may remain within normal limits. 7. Scattered aortic atherosclerosis. 8. Diffuse prominence of the adrenal glands may reflect adrenal hyperplasia. 9. Chronic bilateral renal atrophy, with  scattered calcifications at both kidneys, and postoperative change at the left kidney. Electronically Signed   By: Leotis ShamesJeffery  Chang M.D.   On: 12/04/2016 01:43    EKG: Orders placed or performed during the hospital encounter of 12/05/2016  . ED EKG  . ED EKG  . EKG 12-Lead  . EKG 12-Lead    IMPRESSION AND PLAN: 66 year old female patient with history of end-stage renal disease on dialysis, congestive heart failure, COPD, hypertension, hyperlipidemia presented to the restroom with shortness of breath, cough and chest pain. Admitting diagnosis 1. Decompensated heart failure 2. Acute pulmonary edema 3. Healthcare associated pneumonia 4. End-stage renal disease on dialysis 5. Hypertension Treatment plan Admit patient to telemetry Nephrology consultation for dialysis for excess fluid removal Start patient on IV vancomycin and IV cefepime antibiotics and follow-up cultures DVT prophylaxis subcutaneous heparin Oxygen via nasal cannula Resume renal medications Cardiology consultation and nephrology consultation Supportive care.  All the records are reviewed and case discussed with ED provider. Management plans discussed with the patient, family and they are in agreement.  CODE STATUS:FULL CODE Surrogate decision maker : daughter Code Status History    Date Active Date Inactive Code Status Order ID Comments User Context   10/06/2016  5:11 PM 10/08/2016  7:48 PM Full Code [id number]  Delfino Lovett, MD Inpatient       TOTAL TIME TAKING CARE OF THIS PATIENT: 54 minutes.    Ihor Austin M.D on 12/04/2016 at 3:34 AM  Between 7am to 6pm - Pager - [phone]  After 6pm go to [email or web address] - password [email or web address] [facility]  [facility]  Office  [phone]  CC: Primary care physician; Jerl Mina, MD

## 2016-12-04 NOTE — Progress Notes (Signed)
Patient transported to HD via bed by orderly

## 2016-12-04 NOTE — Progress Notes (Signed)
Pre hd assessment  

## 2016-12-04 NOTE — Progress Notes (Signed)
[employer] [facility] [place] at [facility]   PATIENT NAME: Sheri Myers    MR#:  [id number]  DATE OF BIRTH:  August 04, 1951  SUBJECTIVE:  CHIEF COMPLAINT:   Chief Complaint  Patient presents with  . Shortness of Breath  . Chest Pain   Patient with end-stage renal disease, came with shortness of breath and complaining of chest pain. Recently treated with antibiotic for pneumonia. Found to have infiltrates on CT scan of the chest but no pulmonary edema. Have also enlarged mediastinal lymph nodes. She'll chronic chest and back pains and she is on high-dose multiple pain medications at home, continue to have complain of back pain and chest pain and asking for pain medication repeatedly. REVIEW OF SYSTEMS:  CONSTITUTIONAL: No fever, fatigue or weakness.  EYES: No blurred or double vision.  EARS, NOSE, AND THROAT: No tinnitus or ear pain.  RESPIRATORY: No cough, shortness of breath, wheezing or hemoptysis.  CARDIOVASCULAR: positive for chest pain, no orthopnea, edema.  GASTROINTESTINAL: No nausea, vomiting, diarrhea or abdominal pain.  GENITOURINARY: No dysuria, hematuria.  ENDOCRINE: No polyuria, nocturia,  HEMATOLOGY: No anemia, easy bruising or bleeding SKIN: No rash or lesion. MUSCULOSKELETAL: No joint pain or arthritis.   NEUROLOGIC: No tingling, numbness, weakness.  PSYCHIATRY: No anxiety or depression.   ROS  DRUG ALLERGIES:   Allergies  Allergen Reactions  . Asa [Aspirin] Other (See Comments)    Pt doesn't remember   . Shellfish Allergy Hives    VITALS:  Blood pressure (!) 105/40, pulse (!) 53, temperature 97.7 F (36.5 C), temperature source Oral, resp. rate 16, height 5\' 2"  (1.575 m), weight 75.6 kg (166 lb 10.7 oz), SpO2 100 %.  PHYSICAL EXAMINATION:  GENERAL:  66 y.o.-year-old patient lying in the bed with no acute distress.  EYES: Pupils equal, round, reactive to light and accommodation. No scleral icterus. Extraocular muscles intact.  HEENT: Head  atraumatic, normocephalic. Oropharynx and nasopharynx clear.  NECK:  Supple, no jugular venous distention. No thyroid enlargement, no tenderness.  LUNGS: Normal breath sounds bilaterally, no wheezing, some crepitation. No use of accessory muscles of respiration.  CARDIOVASCULAR: S1, S2 normal. No murmurs, rubs, or gallops.  ABDOMEN: Soft, nontender, nondistended. Bowel sounds present. No organomegaly or mass.  EXTREMITIES: No pedal edema, cyanosis, or clubbing.  NEUROLOGIC: Cranial nerves II through XII are intact. Muscle strength 3-4/5 in all extremities. Sensation intact. Gait not checked.  PSYCHIATRIC: The patient is alert and oriented x 3.  SKIN: No obvious rash, lesion, or ulcer.   Physical Exam LABORATORY PANEL:   CBC  Recent Labs Lab 12/04/16 0526  WBC 8.7  HGB 9.0*  HCT 26.7*  PLT 143*   ------------------------------------------------------------------------------------------------------------------  Chemistries   Recent Labs Lab 12/04/16 0011 12/04/16 0526  NA 140 138  K 3.8 4.0  CL 100* 102  CO2 30 26  GLUCOSE 124* 134*  BUN 18 21*  CREATININE 2.80* 3.18*  CALCIUM 9.0 8.5*  MG 2.0  --   AST 27  --   ALT 15  --   ALKPHOS 158*  --   BILITOT 0.8  --    ------------------------------------------------------------------------------------------------------------------  Cardiac Enzymes  Recent Labs Lab 12/04/16 0526 12/04/16 1117  TROPONINI <0.03 <0.03   ------------------------------------------------------------------------------------------------------------------  RADIOLOGY:  Dg Chest 2 View  Result Date: 12/04/2016 CLINICAL DATA:  Sudden onset of chest pain and dyspnea EXAM: CHEST  2 VIEW COMPARISON:  10/06/2016 FINDINGS: Unchanged moderate cardiomegaly. Mild vascular and interstitial prominence, worsened. New central airspace opacities. No pleural effusions.  IMPRESSION: Vascular and interstitial prominence, as well as mild central airspace  opacities. This may represent interstitial and alveolar edema. No dense focal consolidation. Electronically Signed   By: Ellery Plunkaniel R Mitchell M.D.   On: 12/04/2016 00:28   Ct Chest Wo Contrast  Result Date: 12/04/2016 CLINICAL DATA:  Acute onset of generalized chest pain and shortness of breath. Wheezing. Initial encounter. EXAM: CT CHEST WITHOUT CONTRAST TECHNIQUE: Multidetector CT imaging of the chest was performed following the standard protocol without IV contrast. COMPARISON:  Chest radiograph performed earlier today at 12:22 a.m. FINDINGS: Cardiovascular: The heart is mildly enlarged. Calcification is noted at the aortic and mitral valves. Diffuse coronary artery calcifications are seen. Scattered calcification is noted along the thoracic aorta and proximal great vessels. Mediastinum/Nodes: Numerous enlarged mediastinal nodes are seen, measuring up to 2.0 cm in short axis at the subcarinal region. There is suggestion of underlying bilateral hilar lymphadenopathy, though this is not well assessed without contrast. Trace pericardial fluid remains within normal limits. The thyroid gland is unremarkable. No axillary lymphadenopathy is seen. Mildly prominent subcutaneous varices are seen along the left anterior chest wall. There is nonspecific mild skin thickening about the right areolar region. Lungs/Pleura: A small left pleural effusion is noted, with patchy left basilar airspace opacity and underlying bilateral interstitial prominence. This raises concern for mild interstitial edema, though superimposed pneumonia cannot be excluded. Scattered calcified granulomata are noted at the right lung base. Upper Abdomen: The nodular contour of the liver is compatible with hepatic cirrhosis. The spleen is mildly enlarged, measuring 13.7 cm in length. Scattered calcification is seen along the proximal abdominal aorta and its branches. The patient is status post cholecystectomy, with clips noted at the gallbladder fossa.  There is diffuse prominence of the adrenal glands, which may reflect adrenal hyperplasia. Chronic bilateral renal atrophy is partially characterized, with scattered calcifications at both kidneys, and postoperative change at the left kidney. Musculoskeletal: No acute osseous abnormalities are identified. Anterior bridging osteophytes are seen along the mid to lower thoracic spine. The visualized musculature is unremarkable in appearance. IMPRESSION: 1. Small left pleural effusion, with patchy left basilar airspace opacity and underlying bilateral interstitial prominence. This raises concern for mild interstitial edema, though superimposed pneumonia cannot be excluded. 2. Numerous enlarged mediastinal nodes, measuring up to 2.0 cm in short axis. Suggestion of underlying bilateral hilar lymphadenopathy, though this is not well assessed without contrast. Metastatic disease or lymphoma cannot be excluded. Sarcoidosis might have a similar appearance. These would be amenable to biopsy, as deemed clinically appropriate. 3. Findings of hepatic cirrhosis.  Mild splenomegaly noted. 4. Mild cardiomegaly. Calcification at the aortic and mitral valves. Diffuse coronary artery calcifications seen. 5. Mildly prominent subcutaneous varices along the left anterior abdominal wall. 6. Nonspecific mild skin thickening about the right areolar region. Would correlate for any skin findings; this may remain within normal limits. 7. Scattered aortic atherosclerosis. 8. Diffuse prominence of the adrenal glands may reflect adrenal hyperplasia. 9. Chronic bilateral renal atrophy, with scattered calcifications at both kidneys, and postoperative change at the left kidney. Electronically Signed   By: Roanna RaiderJeffery  Chang M.D.   On: 12/04/2016 01:43    ASSESSMENT AND PLAN:   Active Problems:   Pulmonary edema  * ac on ch diastolic heart failure   Need HD   nephro consult.  * HCAP   CT scan confirmed., this maybe reasonf or chest pain    Broad spectrum Abx,   Folow cultures.  * Chest pain- atypical  Likely due to pneumonia, appreciated Cardio help.   Monitor.   On multiple pain meds at home, continue.  * ESRD   Nephrology to help.  * hypertension   COnt home emds.  * COPD   No exacerbation symptoms, monitor.   All the records are reviewed and case discussed with Care Management/Social Workerr. Management plans discussed with the patient, family and they are in agreement.  CODE STATUS: Full.  TOTAL TIME TAKING CARE OF THIS PATIENT: 35 minutes.   POSSIBLE D/C IN 1-2 DAYS, DEPENDING ON CLINICAL CONDITION.   Altamese Dilling M.D on 12/04/2016   Between 7am to 6pm - Pager - [phone]  After 6pm go to [email or web address] - password [email or web address]  [facility]  Office  [phone]  CC: Primary care physician; Jerl Mina, MD  Note: This dictation was prepared with Dragon dictation along with smaller phrase technology. Any transcriptional errors that result from this process are unintentional.

## 2016-12-04 NOTE — Progress Notes (Signed)
Discussed with Dr. Madelon LipsVacchani patient uncontrolled back pain and borderline low BP.  No new medication added for pain control at this time and parameters added to BP meds.

## 2016-12-05 MED ORDER — MORPHINE SULFATE (PF) 4 MG/ML IV SOLN: 2.0000 mg | INTRAVENOUS | Status: DC | PRN

## 2016-12-05 MED ORDER — HYDROCOD POLST-CPM POLST ER 10-8 MG/5ML PO SUER
5.0000 mL | Freq: Two times a day (BID) | ORAL | Status: DC | PRN
  Administered 2016-12-05: 5 mL via ORAL
  Filled 2016-12-05: qty 5

## 2016-12-05 MED ORDER — EPOETIN ALFA 10000 UNIT/ML IJ SOLN: 10000.0000 [IU] | INTRAMUSCULAR | Status: DC

## 2016-12-05 MED ORDER — ONDANSETRON HCL 4 MG/2ML IJ SOLN
4.0000 mg | Freq: Three times a day (TID) | INTRAMUSCULAR | Status: DC | PRN
  Administered 2016-12-05: 4 mg via INTRAVENOUS
  Filled 2016-12-05: qty 2

## 2016-12-05 MED ORDER — MORPHINE SULFATE (PF) 4 MG/ML IV SOLN
2.0000 mg | Freq: Once | INTRAVENOUS | Status: AC
  Administered 2016-12-05: 2 mg via INTRAVENOUS
  Filled 2016-12-05: qty 1

## 2016-12-05 MED ORDER — BENZONATATE 100 MG PO CAPS
100.0000 mg | ORAL_CAPSULE | Freq: Three times a day (TID) | ORAL | Status: DC | PRN
  Administered 2016-12-05: 100 mg via ORAL
  Filled 2016-12-05: qty 1

## 2016-12-05 NOTE — Progress Notes (Signed)
Spoke to Dr. Elisabeth PigeonVachhani about patient's pain and cough that is not relieved with PO medications. MD stated to give a one time dose of 2mg  of morphine. Will give and continue to monitor.

## 2016-12-05 NOTE — Progress Notes (Signed)
Patient has been alert and oriented. Complaining of chest pain continuously that is worse with coughing. While coughing chest pain is a 10/10. Pain medications are giving patient minimal relief, MD is aware. Given morphine once today, but unable to give again due to patient's blood pressure being low. Hydralazine was discontinued today. Tessalon pearls and robitussin given for cough with almost no relief. Daughter states patient has been like this at home for a long time and has not had any relief. Patient looks a little more comfortable this evening. Will continue to monitor.

## 2016-12-05 NOTE — Progress Notes (Signed)
[facility]  SUBJECTIVE: I don't feel good.   Vitals:   12/05/16 0112 12/05/16 0123 12/05/16 0424 12/05/16 0905  BP: (!) 109/45  (!) 116/41 (!) 148/48  Pulse: 68  64 69  Resp:   18 20  Temp:   99.7 F (37.6 C)   TempSrc:   Oral   SpO2: 95% 95% 100% 99%  Weight:      Height:         Intake/Output Summary (Last 24 hours) at 12/05/16 0920 Last data filed at 12/05/16 0424  Gross per 24 hour  Intake                0 ml  Output             1800 ml  Net            -1800 ml      PHYSICAL EXAM  General: Well developed, well nourished, in no acute distress HEENT:  Normocephalic and atramatic Neck:  No JVD.  Lungs: Clear bilaterally to auscultation and percussion. Heart: HRRR . Normal S1 and S2 without gallops or murmurs.  Abdomen: Bowel sounds are positive, abdomen soft and non-tender  Msk:  Back normal, normal gait. Normal strength and tone for age. Extremities: No clubbing, cyanosis or edema.   Neuro: Alert and oriented X 3. Psych:  Good affect, responds appropriately   LABS: Basic Metabolic Panel:  Recent Labs  16/08/9601/03/18 0011 12/04/16 0526 12/04/16 1117  NA 140 138  --   K 3.8 4.0  --   CL 100* 102  --   CO2 30 26  --   GLUCOSE 124* 134*  --   BUN 18 21*  --   CREATININE 2.80* 3.18*  --   CALCIUM 9.0 8.5*  --   MG 2.0  --   --   PHOS  --   --  4.4   Liver Function Tests:  Recent Labs  12/04/16 0011  AST 27  ALT 15  ALKPHOS 158*  BILITOT 0.8  PROT 7.9  ALBUMIN 3.7    Recent Labs  12/04/16 0011  LIPASE 59*   CBC:  Recent Labs  12/04/16 0011 12/04/16 0526  WBC 4.8 8.7  NEUTROABS 4.0  --   HGB 9.7* 9.0*  HCT 28.4* 26.7*  MCV 98.7 98.5  PLT 174 143*   Cardiac Enzymes:  Recent Labs  12/04/16 0526 12/04/16 1117 12/04/16 1946  TROPONINI <0.03 <0.03 <0.03   BNP: Invalid input(s): POCBNP D-Dimer: No results for input(s): DDIMER in the last 72 hours. Hemoglobin A1C: No results for input(s): HGBA1C in the last 72 hours. Fasting  Lipid Panel: No results for input(s): CHOL, HDL, LDLCALC, TRIG, CHOLHDL, LDLDIRECT in the last 72 hours. Thyroid Function Tests: No results for input(s): TSH, T4TOTAL, T3FREE, THYROIDAB in the last 72 hours.  Invalid input(s): FREET3 Anemia Panel: No results for input(s): VITAMINB12, FOLATE, FERRITIN, TIBC, IRON, RETICCTPCT in the last 72 hours.  Dg Chest 2 View  Result Date: 12/04/2016 CLINICAL DATA:  Sudden onset of chest pain and dyspnea EXAM: CHEST  2 VIEW COMPARISON:  10/06/2016 FINDINGS: Unchanged moderate cardiomegaly. Mild vascular and interstitial prominence, worsened. New central airspace opacities. No pleural effusions. IMPRESSION: Vascular and interstitial prominence, as well as mild central airspace opacities. This may represent interstitial and alveolar edema. No dense focal consolidation. Electronically Signed   By: Ellery Plunkaniel R Mitchell M.D.   On: 12/04/2016 00:28   Ct Chest Wo Contrast  Result Date: 12/04/2016 CLINICAL DATA:  Acute onset of generalized chest pain and shortness of breath. Wheezing. Initial encounter. EXAM: CT CHEST WITHOUT CONTRAST TECHNIQUE: Multidetector CT imaging of the chest was performed following the standard protocol without IV contrast. COMPARISON:  Chest radiograph performed earlier today at 12:22 a.m. FINDINGS: Cardiovascular: The heart is mildly enlarged. Calcification is noted at the aortic and mitral valves. Diffuse coronary artery calcifications are seen. Scattered calcification is noted along the thoracic aorta and proximal great vessels. Mediastinum/Nodes: Numerous enlarged mediastinal nodes are seen, measuring up to 2.0 cm in short axis at the subcarinal region. There is suggestion of underlying bilateral hilar lymphadenopathy, though this is not well assessed without contrast. Trace pericardial fluid remains within normal limits. The thyroid gland is unremarkable. No axillary lymphadenopathy is seen. Mildly prominent subcutaneous varices are seen along the  left anterior chest wall. There is nonspecific mild skin thickening about the right areolar region. Lungs/Pleura: A small left pleural effusion is noted, with patchy left basilar airspace opacity and underlying bilateral interstitial prominence. This raises concern for mild interstitial edema, though superimposed pneumonia cannot be excluded. Scattered calcified granulomata are noted at the right lung base. Upper Abdomen: The nodular contour of the liver is compatible with hepatic cirrhosis. The spleen is mildly enlarged, measuring 13.7 cm in length. Scattered calcification is seen along the proximal abdominal aorta and its branches. The patient is status post cholecystectomy, with clips noted at the gallbladder fossa. There is diffuse prominence of the adrenal glands, which may reflect adrenal hyperplasia. Chronic bilateral renal atrophy is partially characterized, with scattered calcifications at both kidneys, and postoperative change at the left kidney. Musculoskeletal: No acute osseous abnormalities are identified. Anterior bridging osteophytes are seen along the mid to lower thoracic spine. The visualized musculature is unremarkable in appearance. IMPRESSION: 1. Small left pleural effusion, with patchy left basilar airspace opacity and underlying bilateral interstitial prominence. This raises concern for mild interstitial edema, though superimposed pneumonia cannot be excluded. 2. Numerous enlarged mediastinal nodes, measuring up to 2.0 cm in short axis. Suggestion of underlying bilateral hilar lymphadenopathy, though this is not well assessed without contrast. Metastatic disease or lymphoma cannot be excluded. Sarcoidosis might have a similar appearance. These would be amenable to biopsy, as deemed clinically appropriate. 3. Findings of hepatic cirrhosis.  Mild splenomegaly noted. 4. Mild cardiomegaly. Calcification at the aortic and mitral valves. Diffuse coronary artery calcifications seen. 5. Mildly  prominent subcutaneous varices along the left anterior abdominal wall. 6. Nonspecific mild skin thickening about the right areolar region. Would correlate for any skin findings; this may remain within normal limits. 7. Scattered aortic atherosclerosis. 8. Diffuse prominence of the adrenal glands may reflect adrenal hyperplasia. 9. Chronic bilateral renal atrophy, with scattered calcifications at both kidneys, and postoperative change at the left kidney. Electronically Signed   By: Roanna Raider M.D.   On: 12/04/2016 01:43     Echo normal left ventricular function, with LV ejection fraction 55-65%, moderate to severe aortic stenosis, mild mitral valve stenosis  TELEMETRY: Sinus rhythm:  ASSESSMENT AND PLAN:  Active Problems:   Pulmonary edema    1. Chest pain, atypical, pleuritic in nature, secondary to persistent cough, negative troponin, unremarkable ECG 2. Acute on chronic diastolic CHF, appears stable without fluid overload 3. COPD, recent history of pneumonia, persistent nonproductive cough 4. End-stage renal disease on chronic hemodialysis 5. Moderate to severe aortic stenosis, of uncertain clinical significance  Recommendations  1. Agree with current therapy 2. Defer full dose anticoagulation 3. Defer further cardiac workup at  this time  Signed off for now, please call if any questions   Marcina Millard, MD, PhD, [facility] 12/05/2016 9:20 AM

## 2016-12-05 NOTE — Progress Notes (Signed)
Patient called out complaining of nausea and blood clots coming out when blowing her nose. Notified MD Hugelmeyer that patient did not have any PRN orders for nausea and that current medications for cough were not providing any relief. MD placed orders for Zofran and Tussionex. Given to patient and will follow up to see if there is any relief of symptoms. Nursing staff will continue to monitor for any changes in patient status. Lamonte RicherKara A Gerardine Peltz, RN

## 2016-12-05 NOTE — Progress Notes (Signed)
Initial Nutrition Assessment  DOCUMENTATION CODES:   Not applicable  INTERVENTION:  1. Provide Yogurt daily, patient does not want anything else. 2. Encourage and discussed protein options with patient. She is in a lot of pain at the moment and was not very receptive. Will continue to follow-up and encourage.  NUTRITION DIAGNOSIS:   Increased nutrient needs related to chronic illness as evidenced by estimated needs.  GOAL:   Patient will meet greater than or equal to 90% of their needs  MONITOR:   PO intake, Labs, I & O's, Weight trends  REASON FOR ASSESSMENT:   Malnutrition Screening Tool    ASSESSMENT:   Sheri Myers  is a 66 y.o. female with a known history of End-stage Renal disease is on dialysis, congestive heart failure diastolic, COPD, hypertension, hyperlipidemia, arthritis presented to the emergency room with shortness of breath and chest discomfort since one day.   Spoke with Sheri Myers at bedside. She complains of pain, has not eaten anything today. Reports good appetite PTA, eating 3 small meals per day - but denies good protein intake. She does not drink nutrition supplements, does not like them. Claims she has gained 16# recently but is unable to verbalize whether this is fluid or actual weight. Per chart, weight is down 6#/3.6% over 2 months. Nutrition-Focused physical exam completed. Findings are moderate-severe fat depletion, moderate-severe muscle depletion, and moderate edema.  Patient has spot muscle wasting and fat depletions, but not generalized at this point. Labs and medications reviewed: MVI, Epogen injections, Sensipar  Diet Order:  Diet renal/carb modified with fluid restriction Diet-HS Snack? Nothing; Room service appropriate? Yes with Assist; Fluid consistency: Thin  Skin:  Reviewed, no issues  Last BM:  2/2  Height:   Ht Readings from Last 1 Encounters:  12/04/16 5\' 2"  (1.575 m)    Weight:   Wt Readings from Last 1 Encounters:   12/04/16 159 lb 12.8 oz (72.5 kg)    Ideal Body Weight:  50 kg  BMI:  Body mass index is 29.23 kg/m.  Estimated Nutritional Needs:   Kcal:  1467-1590 calories (MSJ x.1.2-1.3)  Protein:  72-87 gm  Fluid:  UOP + 1000cc  EDUCATION NEEDS:   No education needs identified at this time  Dionne AnoWilliam M. Colonel Krauser, MS, RD LDN Inpatient Clinical Dietitian Pager [phone]

## 2016-12-05 NOTE — Progress Notes (Signed)
[employer] - [place] at [facility]   PATIENT NAME: Sheri Myers    MR#:  [id number]  DATE OF BIRTH:  Nov 19, 1950  SUBJECTIVE:  CHIEF COMPLAINT:   Chief Complaint  Patient presents with  . Shortness of Breath  . Chest Pain   Patient with end-stage renal disease, came with shortness of breath and complaining of chest pain. Recently treated with antibiotic for pneumonia. Found to have infiltrates on CT scan of the chest but no pulmonary edema. Have also enlarged mediastinal lymph nodes. She'll chronic chest and back pains and she is on high-dose multiple pain medications at home, continue to have complain of back pain and chest pain and asking for pain medication repeatedly. Said"i am still not feeling good." REVIEW OF SYSTEMS:  CONSTITUTIONAL: No fever, fatigue or weakness.  EYES: No blurred or double vision.  EARS, NOSE, AND THROAT: No tinnitus or ear pain.  RESPIRATORY: No cough, shortness of breath, wheezing or hemoptysis.  CARDIOVASCULAR: positive for chest pain, no orthopnea, edema.  GASTROINTESTINAL: No nausea, vomiting, diarrhea or abdominal pain.  GENITOURINARY: No dysuria, hematuria.  ENDOCRINE: No polyuria, nocturia,  HEMATOLOGY: No anemia, easy bruising or bleeding SKIN: No rash or lesion. MUSCULOSKELETAL: No joint pain or arthritis.   NEUROLOGIC: No tingling, numbness, weakness.  PSYCHIATRY: No anxiety or depression.   ROS  DRUG ALLERGIES:   Allergies  Allergen Reactions  . Asa [Aspirin] Other (See Comments)    Pt doesn't remember   . Shellfish Allergy Hives    VITALS:  Blood pressure (!) 101/28, pulse (!) 59, temperature 97.8 F (36.6 C), temperature source Oral, resp. rate 20, height 5\' 2"  (1.575 m), weight 72.5 kg (159 lb 12.8 oz), SpO2 93 %.  PHYSICAL EXAMINATION:  GENERAL:  66 y.o.-year-old patient lying in the bed with no acute distress.  EYES: Pupils equal, round, reactive to light and accommodation. No scleral icterus. Extraocular  muscles intact.  HEENT: Head atraumatic, normocephalic. Oropharynx and nasopharynx clear.  NECK:  Supple, no jugular venous distention. No thyroid enlargement, no tenderness.  LUNGS: Normal breath sounds bilaterally, no wheezing, some crepitation. No use of accessory muscles of respiration.  CARDIOVASCULAR: S1, S2 normal. No murmurs, rubs, or gallops.  ABDOMEN: Soft, nontender, nondistended. Bowel sounds present. No organomegaly or mass.  EXTREMITIES: No pedal edema, cyanosis, or clubbing.  NEUROLOGIC: Cranial nerves II through XII are intact. Muscle strength 3-4/5 in all extremities. Sensation intact. Gait not checked.  PSYCHIATRIC: The patient is alert and oriented x 3.  SKIN: No obvious rash, lesion, or ulcer.   Physical Exam LABORATORY PANEL:   CBC  Recent Labs Lab 12/04/16 0526  WBC 8.7  HGB 9.0*  HCT 26.7*  PLT 143*   ------------------------------------------------------------------------------------------------------------------  Chemistries   Recent Labs Lab 12/04/16 0011 12/04/16 0526  NA 140 138  K 3.8 4.0  CL 100* 102  CO2 30 26  GLUCOSE 124* 134*  BUN 18 21*  CREATININE 2.80* 3.18*  CALCIUM 9.0 8.5*  MG 2.0  --   AST 27  --   ALT 15  --   ALKPHOS 158*  --   BILITOT 0.8  --    ------------------------------------------------------------------------------------------------------------------  Cardiac Enzymes  Recent Labs Lab 12/04/16 1117 12/04/16 1946  TROPONINI <0.03 <0.03   ------------------------------------------------------------------------------------------------------------------  RADIOLOGY:  Dg Chest 2 View  Result Date: 12/04/2016 CLINICAL DATA:  Sudden onset of chest pain and dyspnea EXAM: CHEST  2 VIEW COMPARISON:  10/06/2016 FINDINGS: Unchanged moderate cardiomegaly. Mild vascular and interstitial prominence, worsened. New  central airspace opacities. No pleural effusions. IMPRESSION: Vascular and interstitial prominence, as well as  mild central airspace opacities. This may represent interstitial and alveolar edema. No dense focal consolidation. Electronically Signed   By: Ellery Plunkaniel R Mitchell M.D.   On: 12/04/2016 00:28   Ct Chest Wo Contrast  Result Date: 12/04/2016 CLINICAL DATA:  Acute onset of generalized chest pain and shortness of breath. Wheezing. Initial encounter. EXAM: CT CHEST WITHOUT CONTRAST TECHNIQUE: Multidetector CT imaging of the chest was performed following the standard protocol without IV contrast. COMPARISON:  Chest radiograph performed earlier today at 12:22 a.m. FINDINGS: Cardiovascular: The heart is mildly enlarged. Calcification is noted at the aortic and mitral valves. Diffuse coronary artery calcifications are seen. Scattered calcification is noted along the thoracic aorta and proximal great vessels. Mediastinum/Nodes: Numerous enlarged mediastinal nodes are seen, measuring up to 2.0 cm in short axis at the subcarinal region. There is suggestion of underlying bilateral hilar lymphadenopathy, though this is not well assessed without contrast. Trace pericardial fluid remains within normal limits. The thyroid gland is unremarkable. No axillary lymphadenopathy is seen. Mildly prominent subcutaneous varices are seen along the left anterior chest wall. There is nonspecific mild skin thickening about the right areolar region. Lungs/Pleura: A small left pleural effusion is noted, with patchy left basilar airspace opacity and underlying bilateral interstitial prominence. This raises concern for mild interstitial edema, though superimposed pneumonia cannot be excluded. Scattered calcified granulomata are noted at the right lung base. Upper Abdomen: The nodular contour of the liver is compatible with hepatic cirrhosis. The spleen is mildly enlarged, measuring 13.7 cm in length. Scattered calcification is seen along the proximal abdominal aorta and its branches. The patient is status post cholecystectomy, with clips noted at the  gallbladder fossa. There is diffuse prominence of the adrenal glands, which may reflect adrenal hyperplasia. Chronic bilateral renal atrophy is partially characterized, with scattered calcifications at both kidneys, and postoperative change at the left kidney. Musculoskeletal: No acute osseous abnormalities are identified. Anterior bridging osteophytes are seen along the mid to lower thoracic spine. The visualized musculature is unremarkable in appearance. IMPRESSION: 1. Small left pleural effusion, with patchy left basilar airspace opacity and underlying bilateral interstitial prominence. This raises concern for mild interstitial edema, though superimposed pneumonia cannot be excluded. 2. Numerous enlarged mediastinal nodes, measuring up to 2.0 cm in short axis. Suggestion of underlying bilateral hilar lymphadenopathy, though this is not well assessed without contrast. Metastatic disease or lymphoma cannot be excluded. Sarcoidosis might have a similar appearance. These would be amenable to biopsy, as deemed clinically appropriate. 3. Findings of hepatic cirrhosis.  Mild splenomegaly noted. 4. Mild cardiomegaly. Calcification at the aortic and mitral valves. Diffuse coronary artery calcifications seen. 5. Mildly prominent subcutaneous varices along the left anterior abdominal wall. 6. Nonspecific mild skin thickening about the right areolar region. Would correlate for any skin findings; this may remain within normal limits. 7. Scattered aortic atherosclerosis. 8. Diffuse prominence of the adrenal glands may reflect adrenal hyperplasia. 9. Chronic bilateral renal atrophy, with scattered calcifications at both kidneys, and postoperative change at the left kidney. Electronically Signed   By: Roanna RaiderJeffery  Chang M.D.   On: 12/04/2016 01:43    ASSESSMENT AND PLAN:   Active Problems:   Pulmonary edema  * ac on ch diastolic heart failure   Need HD   nephro consult appreciated. HD MWF  * HCAP   CT scan confirmed.,  this maybe reason for chest pain   Broad spectrum Abx,   Folow  cultures.   MRSA screen negative- d/c vanc.  * Chest pain- atypical- due to coughing.    Likely due to pneumonia, appreciated Cardio help.   Monitor.   On multiple pain meds at home, continue.  * ESRD   Nephrology to help.  * hypertension   COnt home emds.   D/c hydralazine due to borderline BP.  * COPD   No exacerbation symptoms, monitor.   All the records are reviewed and case discussed with Care Management/Social Workerr. Management plans discussed with the patient, family and they are in agreement.  CODE STATUS: Full.  TOTAL TIME TAKING CARE OF THIS PATIENT: 35 minutes.   POSSIBLE D/C IN 1-2 DAYS, DEPENDING ON CLINICAL CONDITION.   Altamese Dilling M.D on 12/05/2016   Between 7am to 6pm - Pager - [phone]  After 6pm go to [email or web address] - password [email or web address]  [facility] [place] [facility]  Office  [phone]  CC: Primary care physician; Jerl Mina, MD  Note: This dictation was prepared with Dragon dictation along with smaller phrase technology. Any transcriptional errors that result from this process are unintentional.

## 2016-12-05 NOTE — Progress Notes (Signed)
[facility]  ROUNDING NOTE   Subjective:  Patient still having significant cough and pain with cough. Patient had hemodialysis yesterday. In addition she has requested we assume her outpatient dialysis care.   Objective:  Vital signs in last 24 hours:  Temp:  [97.7 F (36.5 C)-99.7 F (37.6 C)] 97.8 F (36.6 C) (02/04 1044) Pulse Rate:  [51-69] 59 (02/04 1044) Resp:  [16-27] 20 (02/04 1044) BP: (82-148)/(28-77) 101/28 (02/04 1044) SpO2:  [93 %-100 %] 93 % (02/04 1044) Weight:  [72.5 kg (159 lb 12.8 oz)-75.6 kg (166 lb 10.7 oz)] 72.5 kg (159 lb 12.8 oz) (02/03 1846)  Weight change: 7.107 kg (15 lb 10.7 oz) Filed Weights   12/04/16 0459 12/04/16 1420 12/04/16 1846  Weight: 75.6 kg (166 lb 11.2 oz) 75.6 kg (166 lb 10.7 oz) 72.5 kg (159 lb 12.8 oz)    Intake/Output: I/O last 3 completed shifts: In: 50 [IV Piggyback:50] Out: 1800 [Other:1800]   Intake/Output this shift:  No intake/output data recorded.  Physical Exam: General: No acute distress  Head: Normocephalic, atraumatic. Moist oral mucosal membranes  Eyes: Anicteric  Neck: Supple, trachea midline  Lungs:  Basilar rales, normal effort  Heart: S1S2 no rubs  Abdomen:  Soft, nontender,   Extremities:  peripheral edema.  Neurologic: Nonfocal, moving all four extremities  Skin: No lesions  Access: LUE AVF    Basic Metabolic Panel:  Recent Labs Lab 12/04/16 0011 12/04/16 0526 12/04/16 1117  NA 140 138  --   K 3.8 4.0  --   CL 100* 102  --   CO2 30 26  --   GLUCOSE 124* 134*  --   BUN 18 21*  --   CREATININE 2.80* 3.18*  --   CALCIUM 9.0 8.5*  --   MG 2.0  --   --   PHOS  --   --  4.4    Liver Function Tests:  Recent Labs Lab 12/04/16 0011  AST 27  ALT 15  ALKPHOS 158*  BILITOT 0.8  PROT 7.9  ALBUMIN 3.7    Recent Labs Lab 12/04/16 0011  LIPASE 59*   No results for input(s): AMMONIA in the last 168 hours.  CBC:  Recent Labs Lab 12/04/16 0011 12/04/16 0526  WBC 4.8  8.7  NEUTROABS 4.0  --   HGB 9.7* 9.0*  HCT 28.4* 26.7*  MCV 98.7 98.5  PLT 174 143*    Cardiac Enzymes:  Recent Labs Lab 12/04/16 0011 12/04/16 0300 12/04/16 0526 12/04/16 1117 12/04/16 1946  TROPONINI <0.03 <0.03 <0.03 <0.03 <0.03    BNP: Invalid input(s): POCBNP  CBG: No results for input(s): GLUCAP in the last 168 hours.  Microbiology: Results for orders placed or performed during the hospital encounter of 01-30-2017  Blood culture (routine x 2)     Status: None (Preliminary result)   Collection Time: 12/04/16  2:50 AM  Result Value Ref Range Status   Specimen Description BLOOD RIGHT ANTECUBITAL  Final   Special Requests   Final    BOTTLES DRAWN AEROBIC AND ANAEROBIC AER6CC, ANA 6CC   Culture NO GROWTH 1 DAY  Final   Report Status PENDING  Incomplete  Blood culture (routine x 2)     Status: None (Preliminary result)   Collection Time: 12/04/16  3:00 AM  Result Value Ref Range Status   Specimen Description BLOOD LEFT ANTECUBITAL  Final   Special Requests   Final    BOTTLES DRAWN AEROBIC AND ANAEROBIC AER 5CC, ANA 6CC  Culture NO GROWTH 1 DAY  Final   Report Status PENDING  Incomplete  MRSA PCR Screening     Status: None   Collection Time: 12/04/16  8:56 PM  Result Value Ref Range Status   MRSA by PCR NEGATIVE NEGATIVE Final    Comment:        The GeneXpert MRSA Assay (FDA approved for NASAL specimens only), is one component of a comprehensive MRSA colonization surveillance program. It is not intended to diagnose MRSA infection nor to guide or monitor treatment for MRSA infections.     Coagulation Studies: No results for input(s): LABPROT, INR in the last 72 hours.  Urinalysis: No results for input(s): COLORURINE, LABSPEC, PHURINE, GLUCOSEU, HGBUR, BILIRUBINUR, KETONESUR, PROTEINUR, UROBILINOGEN, NITRITE, LEUKOCYTESUR in the last 72 hours.  Invalid input(s): APPERANCEUR    Imaging: Dg Chest 2 View  Result Date: 12/04/2016 CLINICAL DATA:  Sudden  onset of chest pain and dyspnea EXAM: CHEST  2 VIEW COMPARISON:  10/06/2016 FINDINGS: Unchanged moderate cardiomegaly. Mild vascular and interstitial prominence, worsened. New central airspace opacities. No pleural effusions. IMPRESSION: Vascular and interstitial prominence, as well as mild central airspace opacities. This may represent interstitial and alveolar edema. No dense focal consolidation. Electronically Signed   By: Ellery Plunk M.D.   On: 12/04/2016 00:28   Ct Chest Wo Contrast  Result Date: 12/04/2016 CLINICAL DATA:  Acute onset of generalized chest pain and shortness of breath. Wheezing. Initial encounter. EXAM: CT CHEST WITHOUT CONTRAST TECHNIQUE: Multidetector CT imaging of the chest was performed following the standard protocol without IV contrast. COMPARISON:  Chest radiograph performed earlier today at 12:22 a.m. FINDINGS: Cardiovascular: The heart is mildly enlarged. Calcification is noted at the aortic and mitral valves. Diffuse coronary artery calcifications are seen. Scattered calcification is noted along the thoracic aorta and proximal great vessels. Mediastinum/Nodes: Numerous enlarged mediastinal nodes are seen, measuring up to 2.0 cm in short axis at the subcarinal region. There is suggestion of underlying bilateral hilar lymphadenopathy, though this is not well assessed without contrast. Trace pericardial fluid remains within normal limits. The thyroid gland is unremarkable. No axillary lymphadenopathy is seen. Mildly prominent subcutaneous varices are seen along the left anterior chest wall. There is nonspecific mild skin thickening about the right areolar region. Lungs/Pleura: A small left pleural effusion is noted, with patchy left basilar airspace opacity and underlying bilateral interstitial prominence. This raises concern for mild interstitial edema, though superimposed pneumonia cannot be excluded. Scattered calcified granulomata are noted at the right lung base. Upper  Abdomen: The nodular contour of the liver is compatible with hepatic cirrhosis. The spleen is mildly enlarged, measuring 13.7 cm in length. Scattered calcification is seen along the proximal abdominal aorta and its branches. The patient is status post cholecystectomy, with clips noted at the gallbladder fossa. There is diffuse prominence of the adrenal glands, which may reflect adrenal hyperplasia. Chronic bilateral renal atrophy is partially characterized, with scattered calcifications at both kidneys, and postoperative change at the left kidney. Musculoskeletal: No acute osseous abnormalities are identified. Anterior bridging osteophytes are seen along the mid to lower thoracic spine. The visualized musculature is unremarkable in appearance. IMPRESSION: 1. Small left pleural effusion, with patchy left basilar airspace opacity and underlying bilateral interstitial prominence. This raises concern for mild interstitial edema, though superimposed pneumonia cannot be excluded. 2. Numerous enlarged mediastinal nodes, measuring up to 2.0 cm in short axis. Suggestion of underlying bilateral hilar lymphadenopathy, though this is not well assessed without contrast. Metastatic disease or lymphoma cannot  be excluded. Sarcoidosis might have a similar appearance. These would be amenable to biopsy, as deemed clinically appropriate. 3. Findings of hepatic cirrhosis.  Mild splenomegaly noted. 4. Mild cardiomegaly. Calcification at the aortic and mitral valves. Diffuse coronary artery calcifications seen. 5. Mildly prominent subcutaneous varices along the left anterior abdominal wall. 6. Nonspecific mild skin thickening about the right areolar region. Would correlate for any skin findings; this may remain within normal limits. 7. Scattered aortic atherosclerosis. 8. Diffuse prominence of the adrenal glands may reflect adrenal hyperplasia. 9. Chronic bilateral renal atrophy, with scattered calcifications at both kidneys, and  postoperative change at the left kidney. Electronically Signed   By: Roanna Raider M.D.   On: 12/04/2016 01:43     Medications:    . ceFEPIme  1 g Intravenous Daily  . cinacalcet  30 mg Oral Q breakfast  . fluticasone furoate-vilanterol  1 puff Inhalation Daily  . heparin  5,000 Units Subcutaneous Q8H  . hydrALAZINE  25 mg Oral Q8H  . ipratropium-albuterol  3 mL Inhalation Q6H  . labetalol  200 mg Oral BID  . mouth rinse  15 mL Mouth Rinse BID  . multivitamin  1 tablet Oral Daily  . oxyCODONE  5 mg Oral Q8H  . sertraline  100 mg Oral QHS  . sodium chloride flush  3 mL Intravenous Q12H  . sodium chloride flush  3 mL Intravenous Q12H   sodium chloride, acetaminophen, albuterol, benzonatate, guaiFENesin-dextromethorphan, morphine injection, oxyCODONE-acetaminophen, senna-docusate, sodium chloride flush  Assessment/ Plan:  66 y.o. white femalewith end stage renal disease on hemodialysis since 2007, COPD, obesity, hypertension, congestive heart failure, coronary artery disease, obstructive sleep apnea, hyperlipidemia, lymphedema  MWF [facility] Nephrology [facility] [address]   1. ESRD on HD MWF. We will plan for hemodialysis again tomorrow. We will plan for ultrafiltration target of 2 kg.  In addition patient has requested that we assume her outpatient dialysis care.  Discussed with Dr. Wynelle Link, arrangements being made for this.   2.  Anemia of chronic kidney disease. Hemoglobin currently 9.0. We will start the patient on Epogen 10,000 units IV with dialysis with next dialysis treatment.  3. Secondary hyperparathyroidism. Phosphorus currently 4.4 and acceptable. Continue to monitor.  4.  Bacterial pneumonia.  Patient currently on cefepime and vancomycin. Management per hospitalist.   LOS: 1 Niobe Dick 2/4/20181:54 PM

## 2016-12-06 ENCOUNTER — Inpatient Hospital Stay: Payer: Medicare Other

## 2016-12-06 DIAGNOSIS — G253 Myoclonus: Secondary | ICD-10-CM

## 2016-12-06 DIAGNOSIS — J810 Acute pulmonary edema: Secondary | ICD-10-CM

## 2016-12-06 DIAGNOSIS — R4189 Other symptoms and signs involving cognitive functions and awareness: Secondary | ICD-10-CM

## 2016-12-06 LAB — CBC
HEMATOCRIT: 30.6 % — AB (ref 35.0–47.0)
HEMOGLOBIN: 9.6 g/dL — AB (ref 12.0–16.0)
MCH: 33.4 pg (ref 26.0–34.0)
MCHC: 31.3 g/dL — ABNORMAL LOW (ref 32.0–36.0)
MCV: 106.8 fL — AB (ref 80.0–100.0)
PLATELETS: 105 10*3/uL — AB (ref 150–440)
RBC: 2.86 MIL/uL — AB (ref 3.80–5.20)
RDW: 20.7 % — ABNORMAL HIGH (ref 11.5–14.5)
WBC: 9.2 10*3/uL (ref 3.6–11.0)

## 2016-12-06 LAB — BLOOD GAS, ARTERIAL
Acid-base deficit: 2.3 mmol/L — ABNORMAL HIGH (ref 0.0–2.0)
Bicarbonate: 23.7 mmol/L (ref 20.0–28.0)
FIO2: 1
MECHVT: 450 mL
O2 Saturation: 99.8 %
PEEP: 5 cmH2O
PH ART: 7.33 — AB (ref 7.350–7.450)
Patient temperature: 37
RATE: 18 resp/min
pCO2 arterial: 45 mmHg (ref 32.0–48.0)
pO2, Arterial: 223 mmHg — ABNORMAL HIGH (ref 83.0–108.0)

## 2016-12-06 LAB — COMPREHENSIVE METABOLIC PANEL
ALK PHOS: 150 U/L — AB (ref 38–126)
ALT: 19 U/L (ref 14–54)
AST: 38 U/L (ref 15–41)
Albumin: 3 g/dL — ABNORMAL LOW (ref 3.5–5.0)
Anion gap: 17 — ABNORMAL HIGH (ref 5–15)
BUN: 32 mg/dL — AB (ref 6–20)
CALCIUM: 8.5 mg/dL — AB (ref 8.9–10.3)
CHLORIDE: 98 mmol/L — AB (ref 101–111)
CO2: 21 mmol/L — AB (ref 22–32)
CREATININE: 4.12 mg/dL — AB (ref 0.44–1.00)
GFR calc Af Amer: 12 mL/min — ABNORMAL LOW (ref >60)
GFR, EST NON AFRICAN AMERICAN: 10 mL/min — AB (ref >60)
Glucose, Bld: 113 mg/dL — ABNORMAL HIGH (ref 65–99)
Potassium: 5.2 mmol/L — ABNORMAL HIGH (ref 3.5–5.1)
Sodium: 136 mmol/L (ref 135–145)
Total Bilirubin: 1.8 mg/dL — ABNORMAL HIGH (ref 0.3–1.2)
Total Protein: 6.6 g/dL (ref 6.5–8.1)

## 2016-12-06 LAB — LIPID PANEL
CHOL/HDL RATIO: 2.7 ratio
CHOLESTEROL: 146 mg/dL (ref 0–200)
HDL: 55 mg/dL (ref >40)
LDL Cholesterol: 72 mg/dL (ref 0–99)
TRIGLYCERIDES: 96 mg/dL (ref <150)
VLDL: 19 mg/dL (ref 0–40)

## 2016-12-06 LAB — MAGNESIUM
MAGNESIUM: 2.3 mg/dL (ref 1.7–2.4)
Magnesium: 2 mg/dL (ref 1.7–2.4)

## 2016-12-06 LAB — TROPONIN I
TROPONIN I: 0.07 ng/mL — AB (ref <0.03)
Troponin I: <0.03 ng/mL (ref <0.03)

## 2016-12-06 LAB — PROTIME-INR
INR: 1.23
PROTHROMBIN TIME: 15.6 s — AB (ref 11.4–15.2)

## 2016-12-06 LAB — GLUCOSE, CAPILLARY
Glucose-Capillary: 162 mg/dL — ABNORMAL HIGH (ref 65–99)
Glucose-Capillary: 130 mg/dL — ABNORMAL HIGH (ref 65–99)

## 2016-12-06 LAB — PHOSPHORUS: PHOSPHORUS: 6.2 mg/dL — AB (ref 2.5–4.6)

## 2016-12-06 LAB — TSH: TSH: 0.922 u[IU]/mL (ref 0.350–4.500)

## 2016-12-06 MED ORDER — POLYVINYL ALCOHOL 1.4 % OP SOLN
1.0000 [drp] | Freq: Four times a day (QID) | OPHTHALMIC | Status: DC | PRN
  Filled 2016-12-06: qty 15

## 2016-12-06 MED ORDER — MIDAZOLAM HCL 2 MG/2ML IJ SOLN: 1.0000 mg | INTRAMUSCULAR | Status: DC | PRN

## 2016-12-06 MED ORDER — DIGOXIN 0.25 MG/ML IJ SOLN
1.0000 ug/kg | Freq: Once | INTRAMUSCULAR | Status: DC
Start: 2016-12-06 — End: 2016-12-06
  Filled 2016-12-06: qty 0.5

## 2016-12-06 MED ORDER — ACETAMINOPHEN 650 MG RE SUPP: 650.0000 mg | Freq: Four times a day (QID) | RECTAL | Status: DC | PRN

## 2016-12-06 MED ORDER — GLYCOPYRROLATE 0.2 MG/ML IJ SOLN: 0.2000 mg | INTRAMUSCULAR | Status: DC | PRN

## 2016-12-06 MED ORDER — ACETAMINOPHEN 325 MG PO TABS: 650.0000 mg | ORAL_TABLET | Freq: Four times a day (QID) | ORAL | Status: DC | PRN

## 2016-12-06 MED ORDER — HALOPERIDOL 0.5 MG PO TABS
0.5000 mg | ORAL_TABLET | ORAL | Status: DC | PRN
  Filled 2016-12-06: qty 1

## 2016-12-06 MED ORDER — NOREPINEPHRINE BITARTRATE 1 MG/ML IV SOLN
0.0000 ug/min | INTRAVENOUS | Status: DC
  Administered 2016-12-06: 5 ug/min via INTRAVENOUS
  Filled 2016-12-06: qty 4

## 2016-12-06 MED ORDER — IOPAMIDOL (ISOVUE-370) INJECTION 76%
75.0000 mL | Freq: Once | INTRAVENOUS | Status: AC | PRN
  Administered 2016-12-06: 75 mL via INTRAVENOUS

## 2016-12-06 MED ORDER — ONDANSETRON 4 MG PO TBDP: 4.0000 mg | ORAL_TABLET | Freq: Four times a day (QID) | ORAL | Status: DC | PRN

## 2016-12-06 MED ORDER — ALBUTEROL SULFATE (2.5 MG/3ML) 0.083% IN NEBU: 2.5000 mg | INHALATION_SOLUTION | RESPIRATORY_TRACT | Status: DC | PRN

## 2016-12-06 MED ORDER — LORAZEPAM 2 MG/ML IJ SOLN: 1.0000 mg | INTRAMUSCULAR | Status: DC | PRN

## 2016-12-06 MED ORDER — HALOPERIDOL LACTATE 2 MG/ML PO CONC
0.5000 mg | ORAL | Status: DC | PRN
  Filled 2016-12-06: qty 0.3

## 2016-12-06 MED ORDER — CHLORHEXIDINE GLUCONATE 0.12% ORAL RINSE (MEDLINE KIT)
15.0000 mL | Freq: Two times a day (BID) | OROMUCOSAL | Status: DC
  Administered 2016-12-06: 15 mL via OROMUCOSAL

## 2016-12-06 MED ORDER — FENTANYL BOLUS VIA INFUSION
25.0000 ug | INTRAVENOUS | Status: DC | PRN
  Filled 2016-12-06: qty 25

## 2016-12-06 MED ORDER — DILTIAZEM HCL 25 MG/5ML IV SOLN
10.0000 mg | Freq: Once | INTRAVENOUS | Status: AC
  Administered 2016-12-06: 10 mg via INTRAVENOUS
  Filled 2016-12-06: qty 5

## 2016-12-06 MED ORDER — LORAZEPAM 2 MG/ML IJ SOLN
2.0000 mg | Freq: Once | INTRAMUSCULAR | Status: AC
  Administered 2016-12-06: 2 mg via INTRAVENOUS
  Filled 2016-12-06: qty 1

## 2016-12-06 MED ORDER — BISACODYL 10 MG RE SUPP: 10.0000 mg | Freq: Every day | RECTAL | Status: DC | PRN

## 2016-12-06 MED ORDER — SODIUM CHLORIDE 0.9 % IV SOLN
0.5000 mg/h | INTRAVENOUS | Status: DC
  Filled 2016-12-06: qty 10

## 2016-12-06 MED ORDER — ONDANSETRON HCL 4 MG/2ML IJ SOLN: 4.0000 mg | Freq: Four times a day (QID) | INTRAMUSCULAR | Status: DC | PRN

## 2016-12-06 MED ORDER — MIDAZOLAM BOLUS VIA INFUSION
1.0000 mg | INTRAVENOUS | Status: DC | PRN
  Filled 2016-12-06: qty 2

## 2016-12-06 MED ORDER — MORPHINE BOLUS VIA INFUSION
4.0000 mg | INTRAVENOUS | Status: DC | PRN
  Filled 2016-12-06: qty 4

## 2016-12-06 MED ORDER — GLYCOPYRROLATE 1 MG PO TABS: 1.0000 mg | ORAL_TABLET | ORAL | Status: DC | PRN

## 2016-12-06 MED ORDER — SENNOSIDES 8.8 MG/5ML PO SYRP: 5.0000 mL | ORAL_SOLUTION | Freq: Two times a day (BID) | ORAL | Status: DC | PRN

## 2016-12-06 MED ORDER — HALOPERIDOL LACTATE 5 MG/ML IJ SOLN: 0.5000 mg | INTRAMUSCULAR | Status: DC | PRN

## 2016-12-06 MED ORDER — DIGOXIN 0.25 MG/ML IJ SOLN
2.0000 ug/kg | Freq: Once | INTRAMUSCULAR | Status: AC
  Administered 2016-12-06: 145 ug via INTRAVENOUS
  Filled 2016-12-06: qty 1

## 2016-12-06 MED ORDER — FAMOTIDINE IN NACL 20-0.9 MG/50ML-% IV SOLN: 20.0000 mg | Freq: Two times a day (BID) | INTRAVENOUS | Status: DC

## 2016-12-06 MED ORDER — FENTANYL CITRATE (PF) 100 MCG/2ML IJ SOLN: 50.0000 ug | Freq: Once | INTRAMUSCULAR | Status: DC

## 2016-12-06 MED ORDER — DIGOXIN 125 MCG PO TABS: 0.0625 mg | ORAL_TABLET | ORAL | Status: DC

## 2016-12-06 MED ORDER — MIDAZOLAM HCL 5 MG/ML IJ SOLN
0.0000 mg/h | INTRAMUSCULAR | Status: DC
  Administered 2016-12-06: 2 mg/h via INTRAVENOUS
  Filled 2016-12-06: qty 10

## 2016-12-06 MED ORDER — MORPHINE 100MG IN NS 100ML (1MG/ML) PREMIX INFUSION: 5.0000 mg/h | INTRAVENOUS | Status: DC

## 2016-12-07 ENCOUNTER — Telehealth: Payer: Self-pay

## 2016-12-07 LAB — URINE CULTURE: CULTURE: NO GROWTH

## 2016-12-07 MED FILL — Medication: Qty: 1 | Status: AC

## 2016-12-07 NOTE — Telephone Encounter (Signed)
Death certificate received and placed in DR's folder to be signed.

## 2016-12-08 NOTE — Telephone Encounter (Signed)
Per Stanford ScotlandSabrina G at front desk, [employer] called and stated that to shred Death Certificate.  Pt's family has decided to go with another funeral home.  This certificate has been shredded per request of [employer]. Rhonda J Cobb

## 2016-12-09 LAB — CULTURE, BLOOD (ROUTINE X 2)
CULTURE: NO GROWTH
Culture: NO GROWTH

## 2016-12-09 NOTE — Telephone Encounter (Signed)
Received Death Certificate from [employer] placed in lbpu box.

## 2016-12-11 LAB — CULTURE, BLOOD (ROUTINE X 2)
CULTURE: NO GROWTH
Culture: NO GROWTH

## 2016-12-30 NOTE — H&P (Signed)
Called by nursing regarding patient with new onset of atrial flutter relation with a rate in the 140s to 150s. Patient is admitted with pulmonary edema, complains only of chest pain associated with her cough. Current blood pressure is 100/50 with a heart rate of 140. Patient with no prior history of atrial fibrillation. Will give dig and continue to monitor on telemetry. Check TSH, mag, phosphorus, troponins. We'll reconsult cardiology Continue to monitor closely and continue

## 2016-12-30 NOTE — Progress Notes (Signed)
CH made initial visit as a referral from the ON-Call CH. Pt was intubated and non-responsive. Daughter was bedside. Daughter stated that she is the only support for her mother. Her brother lives a good distance away and has had a stroke. The father recently passed away. Daughter seemed extremely sad and focused on giving attention to her mother. CH offered the ministry of presence and prayer. CH is available for follow up as needed.   09-15-17 1300  Clinical Encounter Type  Visited With Patient;Patient and family together;Health care provider  Visit Type Initial;Follow-up;Spiritual support;Critical Care;Patient actively dying  Referral From Chaplain;Nurse  Consult/Referral To Chaplain  Spiritual Encounters  Spiritual Needs Prayer;Emotional;Grief support

## 2016-12-30 NOTE — Progress Notes (Signed)
CODE BLUE called this morning at 648. Patient had gone earlier this morning for chest x-ray. Patient was found to have PA and asystole. Patient was given 6 doses of epinephrine and 2 doses of atropine. We shocked her once and patient did then respond and have a pulse. Intensivist PA was present during code. After pulse was found patient is started on pressors and plan to transfer to intensivist service.   Critical care time spent 35 minutes

## 2016-12-30 NOTE — Care Management (Signed)
Patient admitted with heart failure and pneumonia. She receives [facility] HD at [facility] [address] [facility]  Sheri Myers is compliant with her treatment.  She was transferred to ICU early 2/5. Cardiac arrest x 2.  Intubated. Patient is now DNR and prognosis is poor.   Notified Cheryl with [facility] of admission .  Neurology consult. EEG recommended

## 2016-12-30 NOTE — Progress Notes (Signed)
Patient given first dose of digoxin at 0200; HR sustaining in the 150s-170s. Notified MD Pyreddy; 10 mg cardizem IV x1 ordered. Nursing staff will continue to monitor for any changes in patient status. Lamonte RicherKara A Khalila Buechner, RN

## 2016-12-30 NOTE — Progress Notes (Signed)
[facility] went into cardiac arrest again, [facility], 1 dose of epi, mag, calclium, rhythm was PEA.  Vitals reviewed.  Pt unresponsive, pupils NR.  Lung CTA.    Discussed with daughter at bedside; decided to change code status to DNR, explained poor prognosis. Will not escalate care further, continue current management. Will consider change to comfort measure if/when family ready.   Wells Guiles-Deep Payden Docter, M.D.  12/11/2016  Additional critical care time 30 min.  Critical Care Attestation.  I have personally obtained a history, examined the patient, evaluated laboratory and imaging results, formulated the assessment and plan and placed orders. The Patient requires high complexity decision making for assessment and support, frequent evaluation and titration of therapies, application of advanced monitoring technologies and extensive interpretation of multiple databases. The patient has critical illness that could lead imminently to failure of 1 or more organ systems and requires the highest level of physician preparedness to intervene.  Critical Care Time devoted to patient care services described in this note is 30 minutes and is exclusive of time spent in procedures.

## 2016-12-30 NOTE — Progress Notes (Signed)
[facility]  ROUNDING NOTE   Subjective:   Code blue this morning. V-tach to afib. Now transferred to ICU. Intubated, sedated and placed on norepinephrine gtt.    Objective:  Vital signs in last 24 hours:  Temp:  [97.8 F (36.6 C)-98.5 F (36.9 C)] 97.8 F (36.6 C) (02/05 0800) Pulse Rate:  [49-152] 92 (02/05 0441) Resp:  [20] 20 (02/04 1044) BP: (85-109)/(24-50) 85/37 (02/05 0441) SpO2:  [93 %-96 %] 96 % (02/05 0728) FiO2 (%):  [100 %] 100 % (02/05 0728)  Weight change:  Filed Weights   12/04/16 0459 12/04/16 1420 12/04/16 1846  Weight: 75.6 kg (166 lb 11.2 oz) 75.6 kg (166 lb 10.7 oz) 72.5 kg (159 lb 12.8 oz)    Intake/Output: No intake/output data recorded.   Intake/Output this shift:  No intake/output data recorded.  Physical Exam: General: Critically ill  Head: ETT  Eyes: Pupils not reactive to light  Neck: Supple, trachea midline  Lungs:  Basilar rales, mechanical ventilation PRVC 100%  Heart: regular  Abdomen:  Soft, nontender, obese  Extremities: + peripheral edema.  Neurologic: Intubated, and sedated  Skin: No lesions  Access: LUE AVF + bruit, no thrill    Basic Metabolic Panel:  Recent Labs Lab 12/04/16 0011 12/04/16 0526 12/04/16 1117 2017-01-03 0216 03-Jan-2017 0726  NA 140 138  --   --  136  K 3.8 4.0  --   --  5.2*  CL 100* 102  --   --  98*  CO2 30 26  --   --  21*  GLUCOSE 124* 134*  --   --  113*  BUN 18 21*  --   --  32*  CREATININE 2.80* 3.18*  --   --  4.12*  CALCIUM 9.0 8.5*  --   --  8.5*  MG 2.0  --   --  2.0 2.3  PHOS  --   --  4.4  --  6.2*    Liver Function Tests:  Recent Labs Lab 12/04/16 0011 01-03-17 0726  AST 27 38  ALT 15 19  ALKPHOS 158* 150*  BILITOT 0.8 1.8*  PROT 7.9 6.6  ALBUMIN 3.7 3.0*    Recent Labs Lab 12/04/16 0011  LIPASE 59*   No results for input(s): AMMONIA in the last 168 hours.  CBC:  Recent Labs Lab 12/04/16 0011 12/04/16 0526 01/03/2017 0726  WBC 4.8 8.7 9.2   NEUTROABS 4.0  --   --   HGB 9.7* 9.0* 9.6*  HCT 28.4* 26.7* 30.6*  MCV 98.7 98.5 106.8*  PLT 174 143* 105*    Cardiac Enzymes:  Recent Labs Lab 12/04/16 0526 12/04/16 1117 12/04/16 1946 01-03-17 0216 Jan 03, 2017 0726  TROPONINI <0.03 <0.03 <0.03 <0.03 0.07*    BNP: Invalid input(s): POCBNP  CBG:  Recent Labs Lab 2017-01-03 0654 Jan 03, 2017 0739  GLUCAP 162* 130*    Microbiology: Results for orders placed or performed during the hospital encounter of 12/14/2016  Blood culture (routine x 2)     Status: None (Preliminary result)   Collection Time: 12/04/16  2:50 AM  Result Value Ref Range Status   Specimen Description BLOOD RIGHT ANTECUBITAL  Final   Special Requests   Final    BOTTLES DRAWN AEROBIC AND ANAEROBIC AER6CC, ANA 6CC   Culture NO GROWTH 2 DAYS  Final   Report Status PENDING  Incomplete  Blood culture (routine x 2)     Status: None (Preliminary result)   Collection Time: 12/04/16  3:00  AM  Result Value Ref Range Status   Specimen Description BLOOD LEFT ANTECUBITAL  Final   Special Requests   Final    BOTTLES DRAWN AEROBIC AND ANAEROBIC AER 5CC, ANA 6CC   Culture NO GROWTH 2 DAYS  Final   Report Status PENDING  Incomplete  MRSA PCR Screening     Status: None   Collection Time: 12/04/16  8:56 PM  Result Value Ref Range Status   MRSA by PCR NEGATIVE NEGATIVE Final    Comment:        The GeneXpert MRSA Assay (FDA approved for NASAL specimens only), is one component of a comprehensive MRSA colonization surveillance program. It is not intended to diagnose MRSA infection nor to guide or monitor treatment for MRSA infections.     Coagulation Studies:  Recent Labs  12-16-2016 0726  LABPROT 15.6*  INR 1.23    Urinalysis: No results for input(s): COLORURINE, LABSPEC, PHURINE, GLUCOSEU, HGBUR, BILIRUBINUR, KETONESUR, PROTEINUR, UROBILINOGEN, NITRITE, LEUKOCYTESUR in the last 72 hours.  Invalid input(s): APPERANCEUR    Imaging: Dg Chest 2  View  Result Date: 2016/12/16 CLINICAL DATA:  Pneumonia. EXAM: CHEST  2 VIEW COMPARISON:  Chest x-ray and CT scan dated 12/04/2016 FINDINGS: There has been progression of the hazy consolidative infiltrate in the left lung with air bronchograms now present at the left lung base. Right lung remains clear. Heart size and pulmonary vascularity are within normal limits. Small left pleural effusion. IMPRESSION: Progressive infiltrate in the left lung with consolidation at the left lung base with a small left effusion. Findings are consistent with pneumonia. Electronically Signed   By: Lorriane Shire M.D.   On: 12-16-2016 07:15     Medications:   . norepinephrine (LEVOPHED) Adult infusion     . ceFEPIme  1 g Intravenous Daily  . chlorhexidine gluconate (MEDLINE KIT)  15 mL Mouth Rinse BID  . cinacalcet  30 mg Oral Q breakfast  . digoxin  1 mcg/kg Intravenous Once   Followed by  . digoxin  1 mcg/kg Intravenous Once   Followed by  . [START ON 12/07/2016] digoxin  0.0625 mg Oral Q48H  . epoetin (EPOGEN/PROCRIT) injection  10,000 Units Intravenous Q M,W,F-HD  . fentaNYL (SUBLIMAZE) injection  50 mcg Intravenous Once  . fluticasone furoate-vilanterol  1 puff Inhalation Daily  . heparin  5,000 Units Subcutaneous Q8H  . ipratropium-albuterol  3 mL Inhalation Q6H  . labetalol  200 mg Oral BID  . mouth rinse  15 mL Mouth Rinse BID  . multivitamin  1 tablet Oral Daily  . oxyCODONE  5 mg Oral Q8H  . sertraline  100 mg Oral QHS  . sodium chloride flush  3 mL Intravenous Q12H  . sodium chloride flush  3 mL Intravenous Q12H   sodium chloride, acetaminophen, albuterol, bisacodyl, chlorpheniramine-HYDROcodone, fentaNYL, midazolam, midazolam, morphine injection, ondansetron (ZOFRAN) IV, oxyCODONE-acetaminophen, senna-docusate, sennosides, sodium chloride flush  Assessment/ Plan:  66 y.o. white femalewith end stage renal disease on hemodialysis since 2007, COPD, obesity, hypertension, congestive heart  failure, coronary artery disease, obstructive sleep apnea, hyperlipidemia, lymphedema  MWF [facility].   1. ESRD on HD MWF: dialysis for later today. Orders prepared. Unclear how well AVF will function.   2.  Anemia of chronic kidney disease.  - hold epo due to ischemic event  3. Secondary hyperparathyroidism. Holding binders.   4.  Bacterial pneumonia: afebrile. With respiratory failure on mechanical ventilation.  - cefepime and vancomycin.   5. Cardiogenic shock: on norepinephrine.  Prognosis is poor. Discussed with daughter. She has agreed to continue with aggressive care until there is no change of recovery.  Get CT of head today.    LOS: 2 Zimal Weisensel 2018/12/2208:14 AM

## 2016-12-30 NOTE — Procedures (Signed)
Central Venous Catheter Placement: Indication: Patient receiving vesicant or irritant drug.; Patient receiving intravenous therapy for longer than 5 days.; Patient has limited or no vascular access.   Consent emergent  Risks and benefits explained in detail including risk of infection, bleeding, respiratory failure and death..   Hand washing performed prior to starting the procedure.   Procedure: An active timeout was performed and correct patient, name, & ID confirmed.  After explaining risk and benefits, patient was positioned correctly for central venous access. Patient was prepped using strict sterile technique including chlorohexadine preps, sterile drape, sterile gown and sterile gloves.  The area was prepped, draped and anesthetized in the usual sterile manner. Patient comfort was obtained.  A triple lumen catheter was placed in right femoral Vein There was good blood return, catheter caps were placed on lumens, catheter flushed easily, the line was secured and a sterile dressing and BIO-PATCH applied.   An initial attempt was made at left IJ with 1 poke, however the vessel was small and could not cannulated safely.   Ultrasound was used to visualize vasculature and guidance of needle.   Number of Attempts: 1 Complications:none Estimated Blood Loss: minimal Operator: Wells Guileseep Trevian Hayashida, M.D.   Wells Guileseep Shakiyah Cirilo, M.D.  Pulmonary & Critical Care Medicine

## 2016-12-30 NOTE — Progress Notes (Signed)
Patient became bradycardic, lost pulse, CPR started, code called. See code documentation.

## 2016-12-30 NOTE — Consult Note (Signed)
PULMONARY / CRITICAL CARE MEDICINE   Name: Sheri PennaDeborah Naclerio MRN: [id number] DOB: May 19, 1951    ADMISSION DATE:  12/09/2016   CONSULTATION DATE: 06/01/2017  REFERRING MD:  Dr. Juliene PinaMody  CHIEF COMPLAINT: cardiac arrest  HISTORY OF PRESENT ILLNESS:   This is a 66 y/o WF with multiple comorbidities admitted with shortness of breath and chest pain and found to have pneumonia, pulmonary edema, and COPD exacerbation on 11/02/2016. At about 6:45 AM this morning,  a CODE BLUE was called to room 248.Marland Kitchen. Per Patient's nurse, patient was noted to be bradycardic on the monitor and upon arrival in patient's room she was unresponsive. CPR was initiated immediately and sustained for over 30 minutes. Patient received 7 doses of epinephrine 1 dose of atropine, 2 doses of bicarbonate and was shocked for VT rhythm 1. Initial rhythm noted was PEA. Patient is now intubated and transferred to the ICU. She is currently on dopamine infusion and her blood pressure as well as heart rate Stable. Prior to CODE BLUE, patient developed A. fib with RVR and was given 1 dose of discharge seen and diltiazem by Dr. hospitalist team. Patient was recently discharged after being hospitalized and treated for pneumonia in December. She was in the ED on January 22 with hypotension and dizziness and was treated and released. She was seen by her PCP on January 25 with complaints of dyspnea and diagnosed with a URI. She was started on a prednisone taper. She uses oxygen at home. She goes to dialysis Monday, Wednesdays and Fridays. Per her daughter she has been compliant with her hemodialysis.  PAST MEDICAL HISTORY :  She  has a past medical history of Arthritis; CHF (congestive heart failure) (HCC); COPD (chronic obstructive pulmonary disease) (HCC); Hyperlipemia; Hypertension; and Kidney failure.  PAST SURGICAL HISTORY: She  has a past surgical history that includes Insertion of dialysis catheter; Replacement total knee; and Abdominal  hysterectomy.  Allergies  Allergen Reactions  . Asa [Aspirin] Other (See Comments)    Pt doesn't remember   . Shellfish Allergy Hives    No current facility-administered medications on file prior to encounter.    Current Outpatient Prescriptions on File Prior to Encounter  Medication Sig  . acetaminophen (TYLENOL) 325 MG tablet Take 2 tablets (650 mg total) by mouth every 6 (six) hours as needed for mild pain (or Fever >/= 101).  . cinacalcet (SENSIPAR) 30 MG tablet Take 1 tablet (30 mg total) by mouth daily.  . fluticasone furoate-vilanterol (BREO ELLIPTA) 100-25 MCG/INH AEPB Inhale 1 puff into the lungs daily.   . hydrALAZINE (APRESOLINE) 25 MG tablet Take 1 tablet (25 mg total) by mouth every 8 (eight) hours.  Marland Kitchen. ipratropium-albuterol (DUONEB) 0.5-2.5 (3) MG/3ML SOLN Take 3 mLs by nebulization every 8 (eight) hours.  Marland Kitchen. labetalol (NORMODYNE) 200 MG tablet Take 1 tablet (200 mg total) by mouth 2 (two) times daily.  . multivitamin (RENA-VIT) TABS tablet Take 1 tablet by mouth daily.  Marland Kitchen. oxycodone (OXY-IR) 5 MG capsule Take 1 capsule (5 mg total) by mouth every 8 (eight) hours.  Marland Kitchen. oxyCODONE-acetaminophen (ROXICET) 5-325 MG tablet Take 1 tablet by mouth every 6 (six) hours as needed.  . sertraline (ZOLOFT) 100 MG tablet Take 1 tablet (100 mg total) by mouth at bedtime.  Marland Kitchen. albuterol (PROVENTIL) (5 MG/ML) 0.5% nebulizer solution Take 0.5 mLs (2.5 mg total) by nebulization every 6 (six) hours as needed for wheezing or shortness of breath.  Marland Kitchen. albuterol-ipratropium (COMBIVENT) 18-103 MCG/ACT inhaler Inhale 1 puff into the  lungs every 6 (six) hours.  . docusate sodium (COLACE) 100 MG capsule Take 1 capsule (100 mg total) by mouth daily as needed for mild constipation. (Patient not taking: Reported on 12/04/2016)    FAMILY HISTORY:  Her indicated that her mother is deceased. She indicated that her father is deceased.    SOCIAL HISTORY: She  reports that she quit smoking about 26 years ago. She  has never used smokeless tobacco. She reports that she does not drink alcohol.  REVIEW OF SYSTEMS:   Unable to obtain as patient is now intubated  SUBJECTIVE:    VITAL SIGNS: BP (!) 85/37 (BP Location: Right Arm)   Pulse 92   Temp 97.8 F (36.6 C) (Oral)   Resp 20   Ht 5\' 2"  (1.575 m)   Wt 159 lb 12.8 oz (72.5 kg)   SpO2 96%   BMI 29.23 kg/m   HEMODYNAMICS:    VENTILATOR SETTINGS: Vent Mode: PRVC FiO2 (%):  [100 %] 100 % Set Rate:  [18 bmp] 18 bmp Vt Set:  [450 mL] 450 mL PEEP:  [5 cmH20] 5 cmH20 Plateau Pressure:  [21 cmH20] 21 cmH20  INTAKE / OUTPUT: No intake/output data recorded.  PHYSICAL EXAMINATION: General:  Chronically ill-looking Neuro: Unresponsive, positive corneal and pupillary reflexes HEENT:  Normocephalic and atraumatic, and nasal passages without drainage, neck is supple, with good range of motion, no JVD Cardiovascular: Apical pulse palpable, regular, S1, S2 audible, no murmur reculture gallop , +2 pulses bilaterally Lungs: Bilateral breath sounds, intubated, diminished breath sounds in the bases, no wheezing or rhonchi Abdomen:  Obese, normal bowel sounds, palpation reveals no organomegaly Musculoskeletal: No visible joint deformities Skin:  Pale, no rash, multiple ecchymotic areas in upper extremities consistent with venipuncture LABS:  BMET  Recent Labs Lab 12/04/16 0011 12/04/16 0526 12/27/2016 0726  NA 140 138 136  K 3.8 4.0 5.2*  CL 100* 102 98*  CO2 30 26 21*  BUN 18 21* 32*  CREATININE 2.80* 3.18* 4.12*  GLUCOSE 124* 134* 113*    Electrolytes  Recent Labs Lab 12/04/16 0011 12/04/16 0526 12/04/16 1117 12/27/2016 0216 12/07/2016 0726  CALCIUM 9.0 8.5*  --   --  8.5*  MG 2.0  --   --  2.0 2.3  PHOS  --   --  4.4  --  6.2*    CBC  Recent Labs Lab 12/04/16 0011 12/04/16 0526 12/03/2016 0726  WBC 4.8 8.7 9.2  HGB 9.7* 9.0* 9.6*  HCT 28.4* 26.7* 30.6*  PLT 174 143* 105*    Coag's  Recent Labs Lab 12/21/2016 0726   INR 1.23    Sepsis Markers No results for input(s): LATICACIDVEN, PROCALCITON, O2SATVEN in the last 168 hours.  ABG  Recent Labs Lab 12/12/2016 0706  PHART 7.33*  PCO2ART 45  PO2ART 223*    Liver Enzymes  Recent Labs Lab 12/04/16 0011 12/13/2016 0726  AST 27 38  ALT 15 19  ALKPHOS 158* 150*  BILITOT 0.8 1.8*  ALBUMIN 3.7 3.0*    Cardiac Enzymes  Recent Labs Lab 12/04/16 1946 12/13/2016 0216 12/24/2016 0726  TROPONINI <0.03 <0.03 0.07*    Glucose  Recent Labs Lab 12/27/2016 0654  GLUCAP 162*    Imaging Dg Chest 2 View  Result Date: 12/21/2016 CLINICAL DATA:  Pneumonia. EXAM: CHEST  2 VIEW COMPARISON:  Chest x-ray and CT scan dated 12/04/2016 FINDINGS: There has been progression of the hazy consolidative infiltrate in the left lung with air bronchograms now present at the  left lung base. Right lung remains clear. Heart size and pulmonary vascularity are within normal limits. Small left pleural effusion. IMPRESSION: Progressive infiltrate in the left lung with consolidation at the left lung base with a small left effusion. Findings are consistent with pneumonia. Electronically Signed   By: Francene Boyers M.D.   On: 26-Dec-2016 07:15    STUDIES:  2-D echo pending  CULTURES: Blood cultures 2. Sputum culture Urine culture  ANTIBIOTICS:  cefepime   SIGNIFICANT EVENTS: 12/04/2016: ED with complaints of chest pain, shortness of breath and wheezing; admitted with healthcare associated pneumonia, acute CHF exacerbation and pulmonary edema 12/26/16: Cardiopulmonary arrest, transferred to the ICU  LINES/TUBES: Peripheral IVs  DISCUSSION: 66 year old Caucasian female with multiple comorbidities presenting with cardiac arrest, hypoxemic respiratory failure, atrial fibrillation, and cardiogenic shock. Exact etiology of cardiac arrest, unclear but high on the differentials include severe metabolic acidosis, fatal arrhythmia, ACS, pulmonary embolism and pulmonary  edema  ASSESSMENT / PLAN:  PULMONARY A: Acute hypoxemic respiratory failure secondary to cardiac arrest History of COPD History of healthcare acquired pneumonia P:   Continue cefepime Full vent support with current settings ABG and chest x-ray post intubation VAP Protocol Nebulized bronchodilator  CARDIOVASCULAR A:  Cardiac arrest-PEA to ventricular fibrillation; now in sinus rhythm; rule out pulmonary embolism and acute coronary syndrome. Cardiogenic shock Acute congestive heart failure exacerbation History of hypertension History of hyperlipidemia P:  Hemodynamics per ICU protocol Cardiology consult Norepinephrine infusion, titrate to maintain mean arterial blood pressure greater than 65 Hold off on diuresis in light of hypotension  That EKG and 2-D echo Cycle cardiac enzymes CTA chest PE study  RENAL A:   End-stage renal disease on hemodialysis Hyperkalemia Hyperphosphatemia P:   Trend creatinine Hemodialysis per nephrology Monitor and correct electrolytes. Renally dose medications  GASTROINTESTINAL A:   No acute issues P:   Nothing by mouth status. PPI for GI prophylaxis  HEMATOLOGIC A:   Anemia of chronic disease Thrombocytopenia-platelets 105, down from 143 P:  Trend hemoglobin and hematocrit Transfuse for hemoglobin less than 7 g/dl ObtainHITt panel is platelets less than 50  INFECTIOUS A:   Healthcare associated pneumonia Septic versus cardiogenic shock-afebrile, with no leukocytosis P:   Continue cefepime Trend pro-calcitonin and adjust antibiotics accordingly  ENDOCRINE A:   Mild  hyperglycemia without diabetes   P:   Monitor blood glucose every 4 hours with no sliding-scale insulin coverage  NEUROLOGIC A:   Acute metabolic/hypoxic encephalopathy secondary to cardiac arrest P:   RASS goal: 0 TO-1 Stat CT head Monitor neurological status closely   FAMILY  - Updates: Daughter at bedside. Updated on current change in status and  treatment plan.  - Inter-disciplinary family meet or Palliative Care meeting due by:  day 7  Plan of care discussed with Dr. Nicholos Johns and patient's  transitioned to him. Total critical care time 120 minutes  Magdalene S. Tukov ANP-BC Pulmonary and Critical Care Medicine [facility] Pager [phone] or [phone]  Pt seen and examined with NP, above note reflects my findings, assessment, plan.  66 year old Caucasian female with multiple comorbidities presenting with cardiac arrest, hypoxemic respiratory failure, atrial fibrillation, and cardiogenic shock. Exact etiology of cardiac arrest, unclear but high on the differentials include severe metabolic acidosis, fatal arrhythmia, ACS, pulmonary embolism and pulmonary edema.   The patient suffered cardiac arrest early this am of uncertain etiology.  Currently the patient remains hypotensive on the ventilator. The patient is unresponsive, pupils are nonreactive bilaterally, and addition, the patient has tongue fasciculations  with some myoclonic jerks seen in the extremities. Discussed with patient's daughter at bedside, the patient's prognosis appears to be poor. Currently, the patient's family would like her to remain full code. Discussed the case with neurology, the patient will be given a dose of Ativan IV push, and addition, she was started on a Versed drip. Currently, the prognosis appears to be very poor.  -Wells Guiles, M.D.    Critical Care Attestation.  I have personally obtained a history, examined the patient, evaluated laboratory and imaging results, formulated the assessment and plan and placed orders. The Patient requires high complexity decision making for assessment and support, frequent evaluation and titration of therapies, application of advanced monitoring technologies and extensive interpretation of multiple databases. The patient has critical illness that could lead imminently to failure of 1 or more  organ systems and requires the highest level of physician preparedness to intervene.  Critical Care Time devoted to patient care services described in this note is 65 minutes and is exclusive of time spent in procedures.

## 2016-12-30 NOTE — Progress Notes (Signed)
Patient converted to Afib RVR with HR in the 120s-150s; no prior history of Afib noted. BP 100/50. Notified MD Hugelmeyer and MD to place orders. Nursing staff will continue to monitor for any changes in patient status. Lamonte RicherKara A Kyndel Egger, RN

## 2016-12-30 NOTE — Progress Notes (Signed)
Patient DNR and RN may pronounce death.  Patient expired at 1318, Norva Karvonenheryl Greene, RN second confirmation. MD Ram made aware. Family at bedside, support provided.

## 2016-12-30 NOTE — Progress Notes (Signed)
[facility] [place] [facility]   PATIENT NAME: Sheri PennaDeborah Myers    MR#:  [id number]  DATE OF BIRTH:  [birth year]  SUBJECTIVE:  CHIEF COMPLAINT:   Chief Complaint  Patient presents with  . Shortness of Breath  . Chest Pain   Patient with end-stage renal disease, came with shortness of breath and complaining of chest pain. Recently treated with antibiotic for pneumonia. Found to have infiltrates on CT scan of the chest but no pulmonary edema. Have also enlarged mediastinal lymph nodes. She'll chronic chest and back pains and she is on high-dose multiple pain medications at home, continue to have complain of back pain and chest pain and asking for pain medication repeatedly. She had conversion to A fib last night, was given on inj Digoxin and cardizem but early morning- she had bradycardia on telemetry and then had cardiac arrest, transferred to ICU after 25 min of CPR and restoration of NSR.  REVIEW OF SYSTEMS:  Not able to give ROS as intubated now on vent support.  ROS  DRUG ALLERGIES:   Allergies  Allergen Reactions  . Asa [Aspirin] Other (See Comments)    Pt doesn't remember   . Shellfish Allergy Hives    VITALS:  Blood pressure (!) 85/37, pulse 92, temperature 98.5 F (36.9 C), temperature source Oral, resp. rate 20, height 5\' 2"  (1.575 m), weight 72.5 kg (159 lb 12.8 oz), SpO2 96 %.  PHYSICAL EXAMINATION:  GENERAL:  66 y.o.-year-old patient lying in the bed critical appearance.  EYES: Pupils equal, round, dilated to 4-5 mm . No scleral icterus.  HEENT: Head atraumatic, normocephalic. Oropharynx and nasopharynx clear. ETT in place. NECK:  Supple, no jugular venous distention. No thyroid enlargement, no tenderness.  LUNGS: Normal breath sounds bilaterally, no wheezing, some crepitation. On Ventilator support. CARDIOVASCULAR: S1, S2 normal. systolic murmurs.  ABDOMEN: Soft, nontender, nondistended. Bowel sounds present. No organomegaly or mass.   EXTREMITIES: No pedal edema, cyanosis, or clubbing.  NEUROLOGIC: Pupils are dilated, pt is not responsive to stimuli at this time, on ventilator support. PSYCHIATRIC: The patient is comatose after the cardiac arrest.  SKIN: No obvious rash, lesion, or ulcer.   Physical Exam LABORATORY PANEL:   CBC  Recent Labs Lab 12/04/16 0526  WBC 8.7  HGB 9.0*  HCT 26.7*  PLT 143*   ------------------------------------------------------------------------------------------------------------------  Chemistries   Recent Labs Lab 12/04/16 0011 12/04/16 0526 12/18/2016 0216  NA 140 138  --   K 3.8 4.0  --   CL 100* 102  --   CO2 30 26  --   GLUCOSE 124* 134*  --   BUN 18 21*  --   CREATININE 2.80* 3.18*  --   CALCIUM 9.0 8.5*  --   MG 2.0  --  2.0  AST 27  --   --   ALT 15  --   --   ALKPHOS 158*  --   --   BILITOT 0.8  --   --    ------------------------------------------------------------------------------------------------------------------  Cardiac Enzymes  Recent Labs Lab 12/04/16 1946 12/09/2016 0216  TROPONINI <0.03 <0.03   ------------------------------------------------------------------------------------------------------------------  RADIOLOGY:  Dg Chest 2 View  Result Date: 12/14/2016 CLINICAL DATA:  Pneumonia. EXAM: CHEST  2 VIEW COMPARISON:  Chest x-ray and CT scan dated 12/04/2016 FINDINGS: There has been progression of the hazy consolidative infiltrate in the left lung with air bronchograms now present at the left lung base. Right lung remains clear. Heart size and pulmonary vascularity are within normal  limits. Small left pleural effusion. IMPRESSION: Progressive infiltrate in the left lung with consolidation at the left lung base with a small left effusion. Findings are consistent with pneumonia. Electronically Signed   By: Francene Boyers M.D.   On: Dec 09, 2016 07:15    ASSESSMENT AND PLAN:   Active Problems:   Pulmonary edema  * Cardiac arrest- had recent  conversion to A fib RVR  received One dose Digoxin and then Cardizem inj.   Troponin negative this morning.   Other labs are pending.   On vent support.   Now Critical care team will take care of her. I will assign to them as primary team.   Spoke to cardiologist for follow ups.  * ac on ch diastolic heart failure   nephro consult appreciated. HD MWF  * HCAP   CT scan confirmed., this maybe reason for chest pain   Broad spectrum Abx,   Folow cultures.   MRSA screen negative- d/c vanc.   Repeat Xray shows PNA with some effusion.  * Chest pain- atypical- due to coughing.    Likely due to pneumonia, appreciated Cardio help.    Monitor.    On multiple pain meds at home, continue.    Received oxycodone 5 mg 4 hrly during last night( 1 am and 5 am), Did not receive any morphin since 11 am yesterday.  * ESRD   Nephrology to help.  * hypertension   COnt home emds.   D/c hydralazine due to borderline BP.  * COPD   No exacerbation symptoms, monitor.   All the records are reviewed and case discussed with Care Management/Social Workerr. Management plans discussed with the patient, family and they are in agreement.  CODE STATUS: Full.  TOTAL TIME TAKING CARE OF THIS PATIENT: 35 critical care minutes.   POSSIBLE D/C IN ? DAYS, DEPENDING ON CLINICAL CONDITION.   Altamese Dilling M.D on 09-Dec-2016   Between 7am to 6pm - Pager - [phone]  After 6pm go to [email or web address] - password [employer]  [facility]  Office  [phone]  CC: Primary care physician; Jerl Mina, MD  Note: This dictation was prepared with Dragon dictation along with smaller phrase technology. Any transcriptional errors that result from this process are unintentional.

## 2016-12-30 NOTE — Progress Notes (Signed)
Pt noted by RN to be briefly pulseless again. D/w neurology, agree with poor prognosis. D/w family at bedside, agree to make pt comfort measure only, will hold off on EEG at this time.   Wells Guiles--Deep Glenola Wheat, M.D. 12/11/2016

## 2016-12-30 NOTE — ED Provider Notes (Signed)
[facility] Department of Emergency Medicine   Code Blue CONSULT NOTE  Chief Complaint: Cardiac arrest/unresponsive   Level V Caveat: Unresponsive  History of present illness: I was contacted by the hospital for a CODE BLUE cardiac arrest upstairs and presented to the patient's bedside. Reportedly the patient was on the monitor and the staff observed her brady down.  When they checked on her she was unresponsive and pulseless with some electrical activity on the monitor with a rate in the 20s.  They started chest compressions and called a CODE BLUE.  When I arrived chest compressions were being performed and she was getting bag valve mask ventilation.  See course for more details.  Reportedly she was admitted about 2 days ago for shortness of breath and pulmonary edema but she had been stable, seemed to be improving, and was talking to the staff not long before she coded.   ROS: Unable to obtain, Level V caveat  Scheduled Meds: . ceFEPIme  1 g Intravenous Daily  . cinacalcet  30 mg Oral Q breakfast  . digoxin  1 mcg/kg Intravenous Once   Followed by  . digoxin  1 mcg/kg Intravenous Once   Followed by  . [START ON 12/07/2016] digoxin  0.0625 mg Oral Q48H  . epoetin (EPOGEN/PROCRIT) injection  10,000 Units Intravenous Q M,W,F-HD  . fluticasone furoate-vilanterol  1 puff Inhalation Daily  . heparin  5,000 Units Subcutaneous Q8H  . ipratropium-albuterol  3 mL Inhalation Q6H  . labetalol  200 mg Oral BID  . mouth rinse  15 mL Mouth Rinse BID  . multivitamin  1 tablet Oral Daily  . oxyCODONE  5 mg Oral Q8H  . sertraline  100 mg Oral QHS  . sodium chloride flush  3 mL Intravenous Q12H  . sodium chloride flush  3 mL Intravenous Q12H   Continuous Infusions: PRN Meds:.sodium chloride, acetaminophen, albuterol, chlorpheniramine-HYDROcodone, morphine injection, ondansetron (ZOFRAN) IV, oxyCODONE-acetaminophen, senna-docusate, sodium chloride flush Past Medical History:   Diagnosis Date  . Arthritis   . CHF (congestive heart failure) (HCC)   . COPD (chronic obstructive pulmonary disease) (HCC)   . Hyperlipemia   . Hypertension   . Kidney failure    Past Surgical History:  Procedure Laterality Date  . ABDOMINAL HYSTERECTOMY    . INSERTION OF DIALYSIS CATHETER    . REPLACEMENT TOTAL KNEE     Social History   Social History  . Marital status: Single    Spouse name: N/A  . Number of children: N/A  . Years of education: N/A   Occupational History  . Not on file.   Social History Main Topics  . Smoking status: Former Smoker    Quit date: 10/06/1990  . Smokeless tobacco: Never Used  . Alcohol use No  . Drug use: Unknown  . Sexual activity: Not on file   Other Topics Concern  . Not on file   Social History Narrative  . No narrative on file   Allergies  Allergen Reactions  . Asa [Aspirin] Other (See Comments)    Pt doesn't remember   . Shellfish Allergy Hives    Last set of Vital Signs (not current) Vitals:   Dec 18, 2016 0131 18-Dec-2016 0441  BP: (!) 100/50 (!) 85/37  Pulse: (!) 152 92  Resp:    Temp:  98.5 F (36.9 C)      Physical Exam  Gen: unresponsive Cardiovascular: pulseless  Resp: apneic. Breath sounds equal bilaterally with bagging  Abd: nondistended  Neuro:  GCS 3, unresponsive to pain  Neck: No crepitus  Musculoskeletal: No deformity  Skin: warm  Procedures  INTUBATION Performed by: Greggory StallionGeorge (RT) Required items: required blood products, implants, devices, and special equipment available Patient identity confirmed: provided demographic data and hospital-assigned identification number equipment, support staff and site/side marked as required. Indications: Cardiopulmonary arrest  Intubation method: Direct laryngoscopy Preoxygenation: BVM Sedatives: None  Paralytic: None  Tube Size: See nursing notes Post-procedure assessment: chest rise and ETCO2 monitor Breath sounds: Far greater breath sounds on the right  than on the left with no sounds over the epigastrium (I verified with auscultation after the respiratory therapist placed tube).  It was placed at 23 cm at the teeth and given the ongoing code we opted to continue oxygenating and ventilating with it in its current position rather than trying to back it out and risk losing the tube. Tube secured by Respiratory Therapy No immediate complications.  CRITICAL CARE Performed by: Loleta RoseFORBACH, Marlicia Sroka Total critical care time: 20 minutes Critical care time was exclusive of separately billable procedures and treating other patients. Critical care was necessary to treat or prevent imminent or life-threatening deterioration. Critical care was time spent personally by me on the following activities: development of treatment plan with patient and/or surrogate as well as nursing, discussions with consultants, evaluation of patient's response to treatment, examination of patient, obtaining history from patient or surrogate, ordering and performing treatments and interventions, ordering and review of laboratory studies, ordering and review of radiographic studies, pulse oximetry and re-evaluation of patient's condition.  Cardiopulmonary Resuscitation (CPR) Procedure Note  Directed/Performed by: Loleta RoseFORBACH, Xara Paulding I personally directed ancillary staff and/or performed CPR in an effort to regain return of spontaneous circulation and to maintain cardiac, neuro and systemic perfusion.     Assessment and Plan  I was present at bedside for about 20 minutes coordinating CPR.  Four rounds of epi, last pulse check demonstrated asystole.  Dr. Juliene PinaMody with the hospitalist service arrived and took over running the code.  The CCU NP was making contact with the family of the patient.     Loleta Roseory Synda Bagent, MD 06/12/17 [phone]

## 2016-12-30 NOTE — Progress Notes (Signed)
Chaplain On-Call received a page that patient in the ICU17 passed away. Pt. Coded in less than hour ago, but since Chaplain Ben Federal-Mogulhas been providing care, Chaplain On-Call did not want to disrupt the relationship that was already established between Pt's family and Federal-MogulChaplain Ben. Pt. Died less than 40 minutes after coding and being revived. Chaplain On-Call prayed with the daughter and the grandson of the patient and left the family because the daughter wanted to spend sometimes with her deceased mother.

## 2016-12-30 NOTE — Discharge Summary (Signed)
Discharge summary:  Admission date 12/04/16:  Discharge date 12/27/2016  Admission diagnoses: 1. Decompensated heart failure 2. Acute pulmonary edema 3. Healthcare associated pneumonia 4. End-stage renal disease on dialysis 5. Hypertension  Final discharge diagnosis: -As above. -Atrial fibrillation -Cardiac arrest.  Disposition. -Deceased.  Hospital course: Patient was admitted to the hospital with the above problems and 12/04/16. On the morning of 12/21/2016, the patient had new onset atrial flutter with rapid ventricular rate. She became decreased responsive, she was given digoxin, Cardizem. However, the patient developed sudden bradycardia then became pulseless, CODE BLUE was called. The patient was coded for approximately 20 minutes. Spontaneous return of circulation was achieved, the patient was transferred to the intensive care unit.  A right femoral central line was placed, case was discussed with daughter, they opted to keep the patient for code. Soon after it became noticeable that the patient was having tongue fasciculations, clonic jerks versus seizures. She was unresponsive, with dilated pupils bilaterally which were unresponsive. The patient was seen by the neurology service which showed discussed with family the likely poor prognosis, EEG was ordered.   However, before I this could be performed the patient was briefly pulseless again for another 3-4 minutes, 2 mg of magnesium, 1 g of calcium chloride, and 1 mg of epinephrine were given with return of spontaneous circulation. I discussed the poor prognosis, again with the patient's daughter at bedside, they agreed to make the patient DO NOT RESUSCITATE at that time. Subsequently, within an hour. The patient's pulses became weak again, at that time the family instructed as to allow the patient to pass naturally, therefore she was changed to comfort measures only. She passed away soon after.   Wells Guiles-Deep Giordana Weinheimer, M.D.

## 2016-12-30 NOTE — Consult Note (Addendum)
Reason for Consult:Myoclonic jerks Referring Physician: Ashby Dawes  CC: Myoclonic jerks  HPI: Sheri Myers is an 66 y.o. female admitted with shortness of breath and chest pain and found to have pneumonia, pulmonary edema, and COPD exacerbation on 11/02/2016 with later development of atrial fibrillation while hospitalized. At about 6:45 AM this morning,  a CODE BLUE was called to room 248.Marland Kitchen Per Patient's nurse, patient was noted to be bradycardic on the monitor and upon arrival in patient's room she was unresponsive. CPR was initiated immediately and sustained for over 30 minutes. Patient received 7 doses of epinephrine 1 dose of atropine, 2 doses of bicarbonate and was shocked for VT rhythm 1. Initial rhythm noted was PEA. Patient is now intubated and transferred to the ICU. Was noted to develop myoclonic jerks.  Then had another PEA arrest at 1220 with ROSC obtained after 3-4 minutes and one dose of epi, mag and calcium.     Past Medical History:  Diagnosis Date  . Arthritis   . CHF (congestive heart failure) ([place])   . COPD (chronic obstructive pulmonary disease) ([place])   . Hyperlipemia   . Hypertension   . Kidney failure     Past Surgical History:  Procedure Laterality Date  . ABDOMINAL HYSTERECTOMY    . INSERTION OF DIALYSIS CATHETER    . REPLACEMENT TOTAL KNEE      Family History  Problem Relation Age of Onset  . Diabetes Mother     Social History:  reports that she quit smoking about 26 years ago. She has never used smokeless tobacco. She reports that she does not drink alcohol. Her drug history is not on file.  Allergies  Allergen Reactions  . Asa [Aspirin] Other (See Comments)    Pt doesn't remember   . Shellfish Allergy Hives    Medications:  I have reviewed the patient's current medications. Prior to Admission:  Prescriptions Prior to Admission  Medication Sig Dispense Refill Last Dose  . acetaminophen (TYLENOL) 325 MG tablet Take 2 tablets (650 mg total) by  mouth every 6 (six) hours as needed for mild pain (or Fever >/= 101).   prn  . cinacalcet (SENSIPAR) 30 MG tablet Take 1 tablet (30 mg total) by mouth daily. 60 tablet 0 12/21/2016 at Unknown time  . fluticasone furoate-vilanterol (BREO ELLIPTA) 100-25 MCG/INH AEPB Inhale 1 puff into the lungs daily.    12/27/2016 at Unknown time  . hydrALAZINE (APRESOLINE) 25 MG tablet Take 1 tablet (25 mg total) by mouth every 8 (eight) hours. 90 tablet 0 12/10/2016 at Unknown time  . ipratropium-albuterol (DUONEB) 0.5-2.5 (3) MG/3ML SOLN Take 3 mLs by nebulization every 8 (eight) hours.   12/05/2016 at Unknown time  . labetalol (NORMODYNE) 200 MG tablet Take 1 tablet (200 mg total) by mouth 2 (two) times daily. 60 tablet 0 12/04/2016 at Unknown time  . multivitamin (RENA-VIT) TABS tablet Take 1 tablet by mouth daily.  0 12/07/2016 at Unknown time  . oxycodone (OXY-IR) 5 MG capsule Take 1 capsule (5 mg total) by mouth every 8 (eight) hours. 15 capsule 0 12/27/2016 at Unknown time  . oxyCODONE-acetaminophen (ROXICET) 5-325 MG tablet Take 1 tablet by mouth every 6 (six) hours as needed. 15 tablet 0 prn  . sertraline (ZOLOFT) 100 MG tablet Take 1 tablet (100 mg total) by mouth at bedtime. 30 tablet 0 Past Week at Unknown time  . albuterol (PROVENTIL) (5 MG/ML) 0.5% nebulizer solution Take 0.5 mLs (2.5 mg total) by nebulization every 6 (six) hours  as needed for wheezing or shortness of breath. 20 vial 0 prn  . albuterol-ipratropium (COMBIVENT) 18-103 MCG/ACT inhaler Inhale 1 puff into the lungs every 6 (six) hours.   Not Taking at Unknown time  . docusate sodium (COLACE) 100 MG capsule Take 1 capsule (100 mg total) by mouth daily as needed for mild constipation. (Patient not taking: Reported on 12/04/2016) 10 capsule 0 Not Taking at Unknown time   Scheduled: . ceFEPIme  1 g Intravenous Daily  . chlorhexidine gluconate (MEDLINE KIT)  15 mL Mouth Rinse BID  . cinacalcet  30 mg Oral Q breakfast  . epoetin (EPOGEN/PROCRIT) injection   10,000 Units Intravenous Q M,W,F-HD  . fentaNYL (SUBLIMAZE) injection  50 mcg Intravenous Once  . heparin  5,000 Units Subcutaneous Q8H  . ipratropium-albuterol  3 mL Inhalation Q6H  . mouth rinse  15 mL Mouth Rinse BID  . multivitamin  1 tablet Oral Daily  . oxyCODONE  5 mg Oral Q8H  . sertraline  100 mg Oral QHS  . sodium chloride flush  3 mL Intravenous Q12H    ROS: Unable to obtain due to mental status  Physical Examination: Blood pressure (!) 85/37, pulse 92, temperature 97.8 F (36.6 C), temperature source Oral, resp. rate 20, height 5' 2"  (1.575 m), weight 72.5 kg (159 lb 12.8 oz), SpO2 99 %.  HEENT-  Normocephalic, no lesions, without obvious abnormality.  Normal external eye and conjunctiva.  Normal TM's bilaterally.  Normal auditory canals and external ears. Normal external nose, mucus membranes and septum.  Normal pharynx. Cardiovascular- S1, S2 normal, pulses palpable throughout   Lungs- chest clear, no wheezing, rales, normal symmetric air entry Abdomen- soft, non-tender; bowel sounds normal; no masses,  no organomegaly Extremities- no edema Lymph-no adenopathy palpable Musculoskeletal-no joint tenderness, deformity or swelling Skin-warm and dry, no hyperpigmentation, vitiligo, or suspicious lesions  Neurological Examination Mental Status: Patient does not respond to verbal stimuli.  Does not respond to deep sternal rub.  Does not follow commands.  No verbalizations noted.  Cranial Nerves: II: patient does not respond confrontation bilaterally, pupils right 4 mm, left 4 mm,and unreactive bilaterally III,IV,VI: doll's response incomplete with some movement noted in the left eye but not in the right V,VII: corneal reflex absent bilaterally  VIII: patient does not respond to verbal stimuli IX,X: gag reflex reduced, XI: trapezius strength unable to test bilaterally XII: tongue strength unable to test Motor: Extremities flaccid throughout.  No spontaneous movement noted.   No purposeful movements noted. Intermittent generalized myoclonic jerks.   Sensory: Does not respond to noxious stimuli in any extremity. Deep Tendon Reflexes:  1+ throughout with absent AJ's bilaterally Plantars: mute bilaterally Cerebellar: Unable to perform   Laboratory Studies:   Basic Metabolic Panel:  Recent Labs Lab 12/04/16 0011 12/04/16 0526 12/04/16 1117 12/15/16 0216 2016-12-15 0726  NA 140 138  --   --  136  K 3.8 4.0  --   --  5.2*  CL 100* 102  --   --  98*  CO2 30 26  --   --  21*  GLUCOSE 124* 134*  --   --  113*  BUN 18 21*  --   --  32*  CREATININE 2.80* 3.18*  --   --  4.12*  CALCIUM 9.0 8.5*  --   --  8.5*  MG 2.0  --   --  2.0 2.3  PHOS  --   --  4.4  --  6.2*  Liver Function Tests:  Recent Labs Lab 12/04/16 0011 12-26-2016 0726  AST 27 38  ALT 15 19  ALKPHOS 158* 150*  BILITOT 0.8 1.8*  PROT 7.9 6.6  ALBUMIN 3.7 3.0*    Recent Labs Lab 12/04/16 0011  LIPASE 59*   No results for input(s): AMMONIA in the last 168 hours.  CBC:  Recent Labs Lab 12/04/16 0011 12/04/16 0526 2016-12-26 0726  WBC 4.8 8.7 9.2  NEUTROABS 4.0  --   --   HGB 9.7* 9.0* 9.6*  HCT 28.4* 26.7* 30.6*  MCV 98.7 98.5 106.8*  PLT 174 143* 105*    Cardiac Enzymes:  Recent Labs Lab 12/04/16 0526 12/04/16 1117 12/04/16 1946 12-26-16 0216 2016/12/26 0726  TROPONINI <0.03 <0.03 <0.03 <0.03 0.07*    BNP: Invalid input(s): POCBNP  CBG:  Recent Labs Lab December 26, 2016 0654 12-26-2016 0739  GLUCAP 162* 130*    Microbiology: Results for orders placed or performed during the hospital encounter of 12/02/2016  Blood culture (routine x 2)     Status: None (Preliminary result)   Collection Time: 12/04/16  2:50 AM  Result Value Ref Range Status   Specimen Description BLOOD RIGHT ANTECUBITAL  Final   Special Requests   Final    BOTTLES DRAWN AEROBIC AND ANAEROBIC AER6CC, ANA 6CC   Culture NO GROWTH 2 DAYS  Final   Report Status PENDING  Incomplete  Blood  culture (routine x 2)     Status: None (Preliminary result)   Collection Time: 12/04/16  3:00 AM  Result Value Ref Range Status   Specimen Description BLOOD LEFT ANTECUBITAL  Final   Special Requests   Final    BOTTLES DRAWN AEROBIC AND ANAEROBIC AER 5CC, ANA 6CC   Culture NO GROWTH 2 DAYS  Final   Report Status PENDING  Incomplete  MRSA PCR Screening     Status: None   Collection Time: 12/04/16  8:56 PM  Result Value Ref Range Status   MRSA by PCR NEGATIVE NEGATIVE Final    Comment:        The GeneXpert MRSA Assay (FDA approved for NASAL specimens only), is one component of a comprehensive MRSA colonization surveillance program. It is not intended to diagnose MRSA infection nor to guide or monitor treatment for MRSA infections.     Coagulation Studies:  Recent Labs  12-26-16 0726  LABPROT 15.6*  INR 1.23    Urinalysis: No results for input(s): COLORURINE, LABSPEC, PHURINE, GLUCOSEU, HGBUR, BILIRUBINUR, KETONESUR, PROTEINUR, UROBILINOGEN, NITRITE, LEUKOCYTESUR in the last 168 hours.  Invalid input(s): APPERANCEUR  Lipid Panel:     Component Value Date/Time   CHOL 146 12/26/2016 0216   TRIG 96 12/26/2016 0216   HDL 55 12/26/16 0216   CHOLHDL 2.7 2016-12-26 0216   VLDL 19 2016/12/26 0216   LDLCALC 72 26-Dec-2016 0216    HgbA1C: No results found for: HGBA1C  Urine Drug Screen:  No results found for: LABOPIA, COCAINSCRNUR, LABBENZ, AMPHETMU, THCU, LABBARB  Alcohol Level: No results for input(s): ETH in the last 168 hours.  Other results: EKG: atrial fibrillation, rate 141 bpm.  Imaging: Dg Chest 2 View  Result Date: 26-Dec-2016 CLINICAL DATA:  Pneumonia. EXAM: CHEST  2 VIEW COMPARISON:  Chest x-ray and CT scan dated 12/04/2016 FINDINGS: There has been progression of the hazy consolidative infiltrate in the left lung with air bronchograms now present at the left lung base. Right lung remains clear. Heart size and pulmonary vascularity are within normal limits.  Small left pleural effusion. IMPRESSION:  Progressive infiltrate in the left lung with consolidation at the left lung base with a small left effusion. Findings are consistent with pneumonia. Electronically Signed   By: Lorriane Shire M.D.   On: 12/19/16 07:15   Ct Head Wo Contrast  Result Date: Dec 19, 2016 CLINICAL DATA:  Unresponsive, recent code EXAM: CT HEAD WITHOUT CONTRAST TECHNIQUE: Contiguous axial images were obtained from the base of the skull through the vertex without intravenous contrast. COMPARISON:  None. FINDINGS: Brain: No evidence of acute infarction, hemorrhage, hydrocephalus, extra-axial collection or mass lesion/mass effect. Mild atrophic changes are noted. Vascular: No hyperdense vessel or unexpected calcification. Skull: Normal. Negative for fracture or focal lesion. Sinuses/Orbits: No acute finding. Other: None. IMPRESSION: Mild atrophic changes without acute abnormality. Electronically Signed   By: Inez Catalina M.D.   On: December 19, 2016 11:58   Ct Angio Chest Pe W Or Wo Contrast  Result Date: 12-19-2016 CLINICAL DATA:  Respiratory failure requiring intubation. EXAM: CT ANGIOGRAPHY CHEST WITH CONTRAST TECHNIQUE: Multidetector CT imaging of the chest was performed using the standard protocol during bolus administration of intravenous contrast. Multiplanar CT image reconstructions and MIPs were obtained to evaluate the vascular anatomy. CONTRAST:  75 mL of Isovue 370 intravenously. COMPARISON:  Radiographs of same day.  CT scan of December 04, 2016. FINDINGS: Cardiovascular: Satisfactory opacification of the pulmonary arteries to the segmental level. No evidence of pulmonary embolism. Mild cardiac enlargement is noted. Minimal pericardial effusion is noted. Coronary artery calcifications are noted. Atherosclerosis of thoracic aorta is noted without aneurysm formation. Mediastinum/Nodes: Endotracheal and nasogastric tubes are in grossly good position. Stable mediastinal adenopathy is noted as  described on prior exam. Lungs/Pleura: Mild bilateral pleural effusions are noted with adjacent subsegmental atelectasis. Airspace opacity is noted in right upper lobe anteriorly as well as in left lower lobe concerning for possible pneumonia. No pneumothorax is noted. Upper Abdomen: No acute abnormality. Musculoskeletal: No chest wall abnormality. No acute or significant osseous findings. Review of the MIP images confirms the above findings. IMPRESSION: No definite evidence of pulmonary embolus. Aortic atherosclerosis. Minimal pericardial effusion. Coronary artery calcifications noted suggesting coronary artery disease. Mild bilateral pleural effusions are noted with adjacent subsegmental atelectasis. Airspace opacity is noted in right upper lobe and left lower lobe concerning for possible pneumonia. Enlarged mediastinal adenopathy is again noted which may be inflammatory or reactive in etiology, but metastatic disease cannot be excluded. Clinical correlation is recommended. Electronically Signed   By: Marijo Conception, M.D.   On: 2016/12/19 12:07     Assessment/Plan: 70 year ols female s/p arrest X 2.  Intubated on no sedation.  Pupils unreactivie and patient with myoclonic jerks.  Suspect some degree of anoxic brain injury.  Head CT from today reviewed and shows no acute changes.  No evidence of anoxia noted.  Further work up recommended.    Recommendations: 1.  EEG 2.  Will consider need for further imaging in AM.    Case discussed with Dr. Ashby Dawes and family  Alexis Goodell, MD Neurology [phone] Dec 19, 2016, 12:43 PM

## 2016-12-30 NOTE — Progress Notes (Signed)
SLP Cancellation Note  Patient Details Name: Sheri PennaDeborah Myers MRN: [id number] DOB: 01-11-1951   Cancelled treatment:       Reason Eval/Treat Not Completed: Patient declined, no reason specified (chart reviewed; pt is now orally intubated post code blue). MD to reconsult ST services post extubation when pt's medical status improves and she is appropriate for therapy.    Jerilynn SomKatherine Watson, MS, CCC-SLP Watson,Katherine 12/05/2016, 8:20 AM

## 2016-12-30 NOTE — Consult Note (Signed)
[facility]  CARDIOLOGY CONSULT NOTE  Patient ID: Sheri Myers MRN: [id number] DOB/AGE: October 21, 1951 66 y.o.  Admit date: 12/07/16 Referring Physician Elisabeth Pigeon Primary Physician [facility] Primary Cardiologist Fath Reason for Consultation Atrial flutter with rapid ventricular rate  HPI: 66 year old female referred for atrial flutter with rapid ventricular rate. Patient has a history of essential hypertension, chronic diastolic congestive heart failure, moderate to severe aortic stenosis, COPD with concomitant pneumonia, and end-stage renal disease on chronic hemodialysis. Patient was admitted for pneumonia. Patient developed new-onset atrial flutter with RVR at a rate of 140-150s early this morning, 12/26/2016, and was treated with cardizem and digoxin. Patient became bradycardic and went into cardiac arrest, and was transferred to ICU after CPR and restoration of normal sinus rhythm. Patient is currently intubated and in normal sinus rhythm.   ROS unable to be obtained as patient is intubated and comatose.     Past Medical History:  Diagnosis Date  . Arthritis   . CHF (congestive heart failure) (HCC)   . COPD (chronic obstructive pulmonary disease) (HCC)   . Hyperlipemia   . Hypertension   . Kidney failure     Past Surgical History:  Procedure Laterality Date  . ABDOMINAL HYSTERECTOMY    . INSERTION OF DIALYSIS CATHETER    . REPLACEMENT TOTAL KNEE      Prescriptions Prior to Admission  Medication Sig Dispense Refill Last Dose  . acetaminophen (TYLENOL) 325 MG tablet Take 2 tablets (650 mg total) by mouth every 6 (six) hours as needed for mild pain (or Fever >/= 101).   prn  . cinacalcet (SENSIPAR) 30 MG tablet Take 1 tablet (30 mg total) by mouth daily. 60 tablet 0 07-Dec-2016 at Unknown time  . fluticasone furoate-vilanterol (BREO ELLIPTA) 100-25 MCG/INH AEPB Inhale 1 puff into the lungs daily.    12/07/2016 at Unknown time  . hydrALAZINE (APRESOLINE) 25 MG tablet Take 1 tablet (25 mg  total) by mouth every 8 (eight) hours. 90 tablet 0 Dec 07, 2016 at Unknown time  . ipratropium-albuterol (DUONEB) 0.5-2.5 (3) MG/3ML SOLN Take 3 mLs by nebulization every 8 (eight) hours.   12-07-16 at Unknown time  . labetalol (NORMODYNE) 200 MG tablet Take 1 tablet (200 mg total) by mouth 2 (two) times daily. 60 tablet 0 12-07-2016 at Unknown time  . multivitamin (RENA-VIT) TABS tablet Take 1 tablet by mouth daily.  0 12-07-2016 at Unknown time  . oxycodone (OXY-IR) 5 MG capsule Take 1 capsule (5 mg total) by mouth every 8 (eight) hours. 15 capsule 0 December 07, 2016 at Unknown time  . oxyCODONE-acetaminophen (ROXICET) 5-325 MG tablet Take 1 tablet by mouth every 6 (six) hours as needed. 15 tablet 0 prn  . sertraline (ZOLOFT) 100 MG tablet Take 1 tablet (100 mg total) by mouth at bedtime. 30 tablet 0 Past Week at Unknown time  . albuterol (PROVENTIL) (5 MG/ML) 0.5% nebulizer solution Take 0.5 mLs (2.5 mg total) by nebulization every 6 (six) hours as needed for wheezing or shortness of breath. 20 vial 0 prn  . albuterol-ipratropium (COMBIVENT) 18-103 MCG/ACT inhaler Inhale 1 puff into the lungs every 6 (six) hours.   Not Taking at Unknown time  . docusate sodium (COLACE) 100 MG capsule Take 1 capsule (100 mg total) by mouth daily as needed for mild constipation. (Patient not taking: Reported on 12/04/2016) 10 capsule 0 Not Taking at Unknown time   Social History   Social History  . Marital status: Single    Spouse name: N/A  . Number of children:  N/A  . Years of education: N/A   Occupational History  . Not on file.   Social History Main Topics  . Smoking status: Former Smoker    Quit date: 10/06/1990  . Smokeless tobacco: Never Used  . Alcohol use No  . Drug use: Unknown  . Sexual activity: Not on file   Other Topics Concern  . Not on file   Social History Narrative  . No narrative on file    Family History  Problem Relation Age of Onset  . Diabetes Mother       Review of systems complete and  found to be negative unless listed above      PHYSICAL EXAM  General: Critical condition, Intubated.  HEENT:  Normocephalic and atramatic Neck:  No JVD.  Lungs: On ventilator support Heart: HRRR . Normal S1 and S2, 2-3/6 systolic murmur Abdomen: Bowel sounds are positive, abdomen soft and non-tender  Msk:  Patient lying supine in bed Extremities: No clubbing, cyanosis or edema.   Neuro:  intubated Psych: intubated  Labs:   Lab Results  Component Value Date   WBC 9.2 12/04/2016   HGB 9.6 (L) 12/22/2016   HCT 30.6 (L) 12/16/2016   MCV 106.8 (H) 12/10/2016   PLT 105 (L) 12/27/2016    Recent Labs Lab 12/23/2016 0726  NA 136  K 5.2*  CL 98*  CO2 21*  BUN 32*  CREATININE 4.12*  CALCIUM 8.5*  PROT 6.6  BILITOT 1.8*  ALKPHOS 150*  ALT 19  AST 38  GLUCOSE 113*   Lab Results  Component Value Date   TROPONINI 0.07 (HH) 12/16/2016    Lab Results  Component Value Date   CHOL 146 12/24/2016   Lab Results  Component Value Date   HDL 55 12/23/2016   Lab Results  Component Value Date   LDLCALC 72 12/24/2016   Lab Results  Component Value Date   TRIG 96 12/09/2016   Lab Results  Component Value Date   CHOLHDL 2.7 12/16/2016   No results found for: LDLDIRECT    Radiology: Dg Chest 2 View  Result Date: 12/02/2016 CLINICAL DATA:  Pneumonia. EXAM: CHEST  2 VIEW COMPARISON:  Chest x-ray and CT scan dated 12/04/2016 FINDINGS: There has been progression of the hazy consolidative infiltrate in the left lung with air bronchograms now present at the left lung base. Right lung remains clear. Heart size and pulmonary vascularity are within normal limits. Small left pleural effusion. IMPRESSION: Progressive infiltrate in the left lung with consolidation at the left lung base with a small left effusion. Findings are consistent with pneumonia. Electronically Signed   By: Francene BoyersJames  Maxwell M.D.   On: 12/16/2016 07:15   Dg Chest 2 View  Result Date: 12/04/2016 CLINICAL DATA:   Sudden onset of chest pain and dyspnea EXAM: CHEST  2 VIEW COMPARISON:  10/06/2016 FINDINGS: Unchanged moderate cardiomegaly. Mild vascular and interstitial prominence, worsened. New central airspace opacities. No pleural effusions. IMPRESSION: Vascular and interstitial prominence, as well as mild central airspace opacities. This may represent interstitial and alveolar edema. No dense focal consolidation. Electronically Signed   By: Ellery Plunkaniel R Mitchell M.D.   On: 12/04/2016 00:28   Ct Chest Wo Contrast  Result Date: 12/04/2016 CLINICAL DATA:  Acute onset of generalized chest pain and shortness of breath. Wheezing. Initial encounter. EXAM: CT CHEST WITHOUT CONTRAST TECHNIQUE: Multidetector CT imaging of the chest was performed following the standard protocol without IV contrast. COMPARISON:  Chest radiograph performed earlier today at 12:22  a.m. FINDINGS: Cardiovascular: The heart is mildly enlarged. Calcification is noted at the aortic and mitral valves. Diffuse coronary artery calcifications are seen. Scattered calcification is noted along the thoracic aorta and proximal great vessels. Mediastinum/Nodes: Numerous enlarged mediastinal nodes are seen, measuring up to 2.0 cm in short axis at the subcarinal region. There is suggestion of underlying bilateral hilar lymphadenopathy, though this is not well assessed without contrast. Trace pericardial fluid remains within normal limits. The thyroid gland is unremarkable. No axillary lymphadenopathy is seen. Mildly prominent subcutaneous varices are seen along the left anterior chest wall. There is nonspecific mild skin thickening about the right areolar region. Lungs/Pleura: A small left pleural effusion is noted, with patchy left basilar airspace opacity and underlying bilateral interstitial prominence. This raises concern for mild interstitial edema, though superimposed pneumonia cannot be excluded. Scattered calcified granulomata are noted at the right lung base.  Upper Abdomen: The nodular contour of the liver is compatible with hepatic cirrhosis. The spleen is mildly enlarged, measuring 13.7 cm in length. Scattered calcification is seen along the proximal abdominal aorta and its branches. The patient is status post cholecystectomy, with clips noted at the gallbladder fossa. There is diffuse prominence of the adrenal glands, which may reflect adrenal hyperplasia. Chronic bilateral renal atrophy is partially characterized, with scattered calcifications at both kidneys, and postoperative change at the left kidney. Musculoskeletal: No acute osseous abnormalities are identified. Anterior bridging osteophytes are seen along the mid to lower thoracic spine. The visualized musculature is unremarkable in appearance. IMPRESSION: 1. Small left pleural effusion, with patchy left basilar airspace opacity and underlying bilateral interstitial prominence. This raises concern for mild interstitial edema, though superimposed pneumonia cannot be excluded. 2. Numerous enlarged mediastinal nodes, measuring up to 2.0 cm in short axis. Suggestion of underlying bilateral hilar lymphadenopathy, though this is not well assessed without contrast. Metastatic disease or lymphoma cannot be excluded. Sarcoidosis might have a similar appearance. These would be amenable to biopsy, as deemed clinically appropriate. 3. Findings of hepatic cirrhosis.  Mild splenomegaly noted. 4. Mild cardiomegaly. Calcification at the aortic and mitral valves. Diffuse coronary artery calcifications seen. 5. Mildly prominent subcutaneous varices along the left anterior abdominal wall. 6. Nonspecific mild skin thickening about the right areolar region. Would correlate for any skin findings; this may remain within normal limits. 7. Scattered aortic atherosclerosis. 8. Diffuse prominence of the adrenal glands may reflect adrenal hyperplasia. 9. Chronic bilateral renal atrophy, with scattered calcifications at both kidneys, and  postoperative change at the left kidney. Electronically Signed   By: Roanna Raider M.D.   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 Atrial flutter with RVR, treated with Cardizem and digoxin, now in normal sinus rhythm. 2. Acute on chronic diastolic CHF, does not appear to be fluid-overloaded 3. End-stage renal disease on chronic hemodialysis 4. Moderate to severe aortic stenosis or uncertain clinical significance. 5. COPD with current treatment for pneumonia  Recommendations: 1. Agree with current therapy. 2. Continue digoxin. 3. Defer full-dose anticoagulation at this time. 4. No further cardiac diagnostics at this time.   Signed: Leanora Ivanoff, PA-C 01-03-2017, 9:11 AM

## 2016-12-30 NOTE — Progress Notes (Signed)
CH responded to PG that Pt had coded. CH escorted Daughter from waiting room area to IC 17. Pt was revived, but MD stated that it did not look good... MD suggested a DNR to which the daughter agreed. CH gave time for daughter to spend with her mother. CH is available for follow up as needed.

## 2016-12-30 NOTE — Progress Notes (Signed)
Secretary notified RN that patient was having pauses and HR was trending down. RN and NT went to check on patient and found her unresponsive. Initiated [employer] and started CPR. MD was paged and family notified. Patient's daughter arrived and requested that everything be done to save her mother. Pulse was brought back and patient was transferred to ICU17. Report given at bedside. Lamonte RicherKara A Deira Shimer, RN

## 2016-12-30 DEATH — deceased

## 2018-04-23 IMAGING — CT CT ANGIO CHEST
1 of 6 series · 18 of 36 positions shown · IV contrast (isovue)
Comparison: Radiographs of same day.  CT scan December 04, 2016.

CLINICAL DATA: Respiratory failure requiring intubation.

EXAM:
CT ANGIOGRAPHY CHEST WITH CONTRAST
TECHNIQUE: Multidetector CT imaging of the chest was performed using the
standard protocol during bolus administration of intravenous
contrast. Multiplanar CT image reconstructions and MIPs were
obtained to evaluate the vascular anatomy.
CONTRAST:  75 mL of Isovue 370 intravenously.

[Series 5: thins · axial · 0.67mm/px · z∈[+52,+260]mm · 18 of 233 slices shown]
[im 12/233  lung]
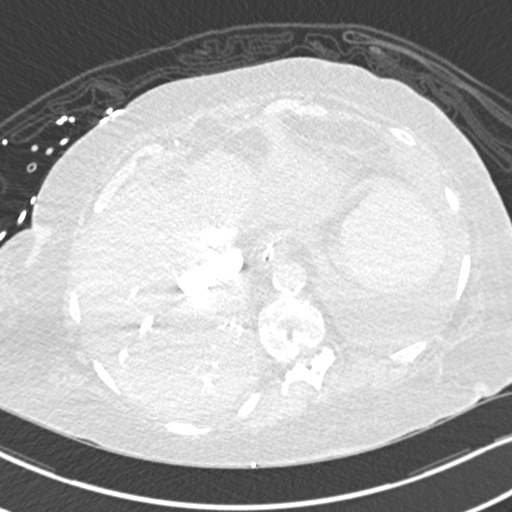
[im 24/233  mediastinal]
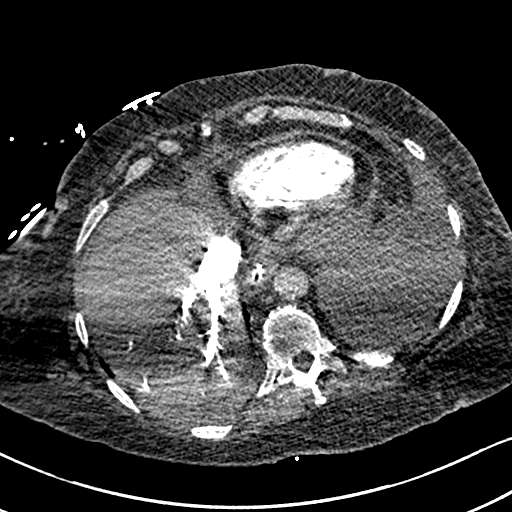
[im 35/233  lung]
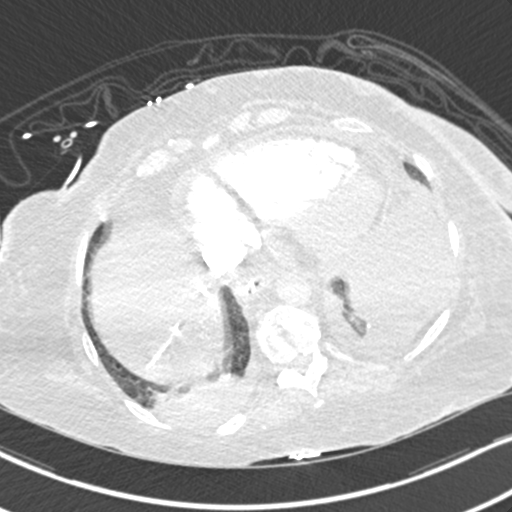
[im 47/233  mediastinal]
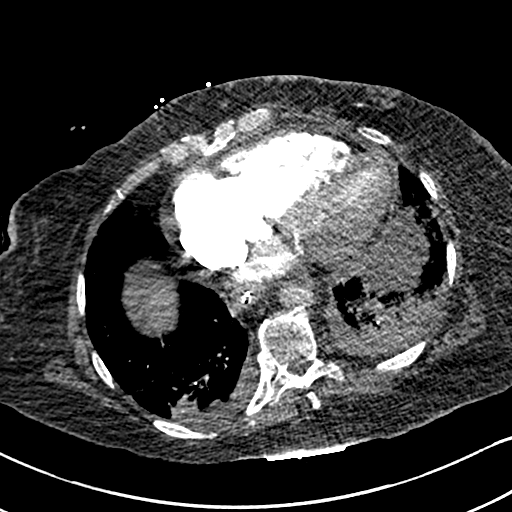
[im 59/233  lung]
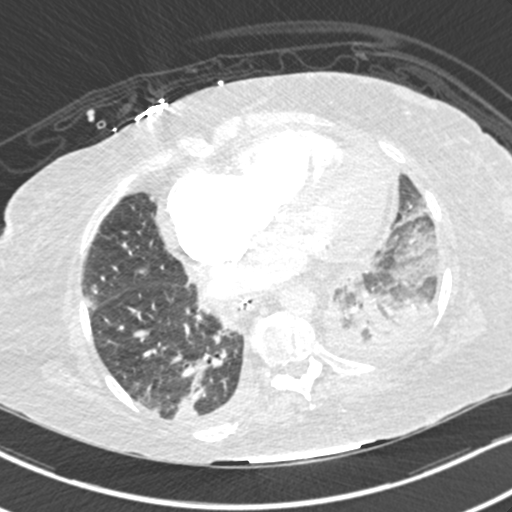
[im 70/233  mediastinal]
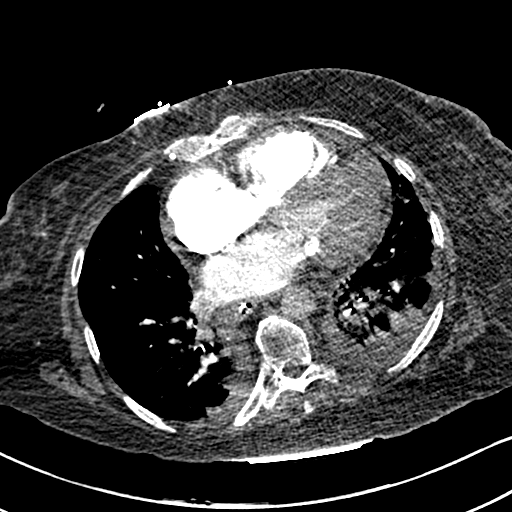
[im 82/233  lung]
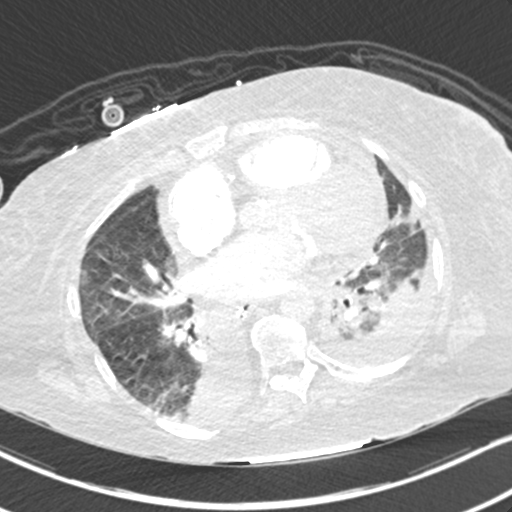
[im 93/233  mediastinal]
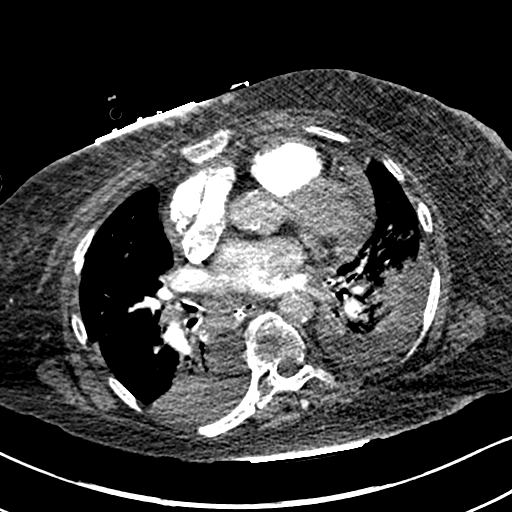
[im 105/233  lung]
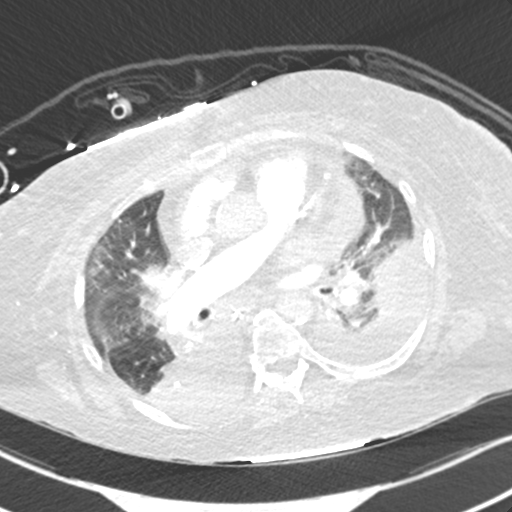
[im 128/233  mediastinal]
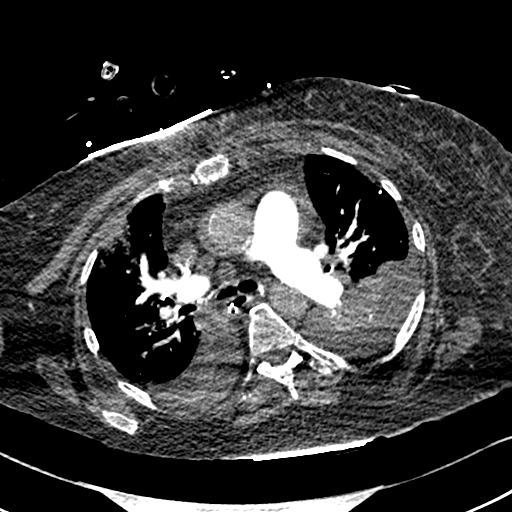
[im 140/233  lung]
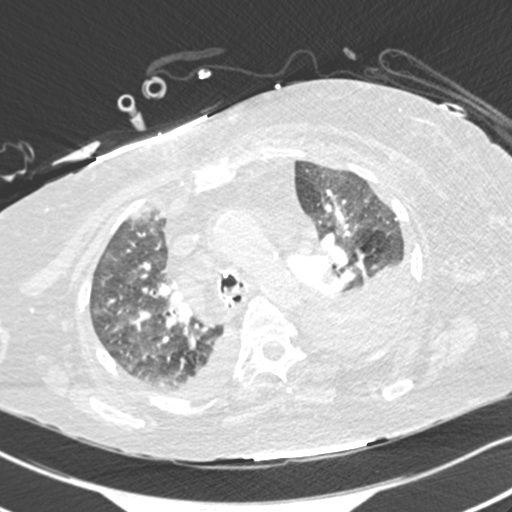
[im 151/233  mediastinal]
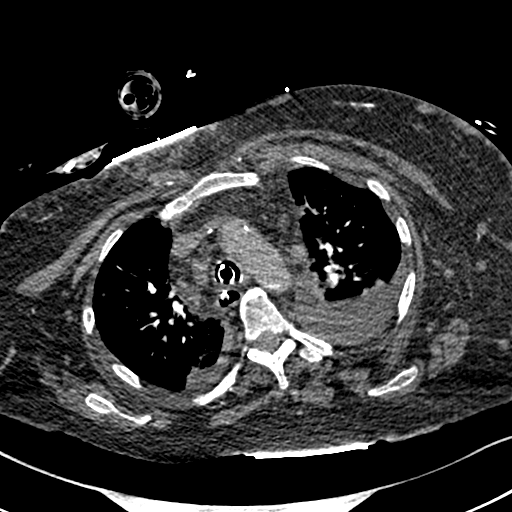
[im 163/233  lung]
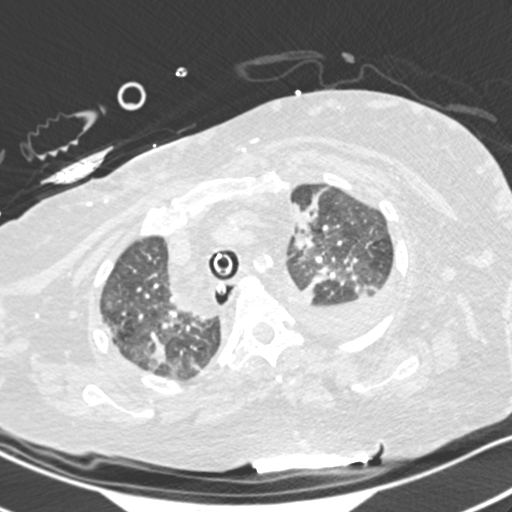
[im 175/233  mediastinal]
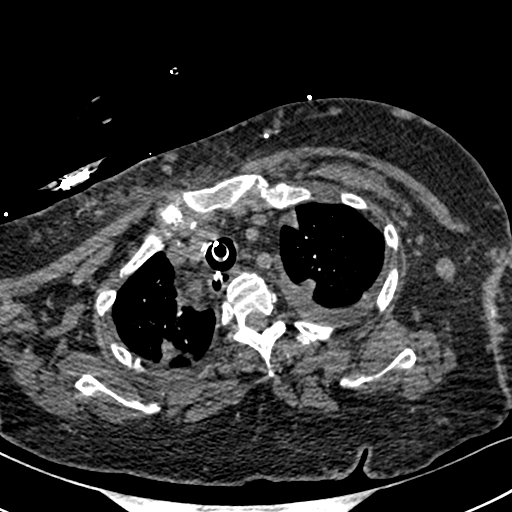
[im 186/233  lung]
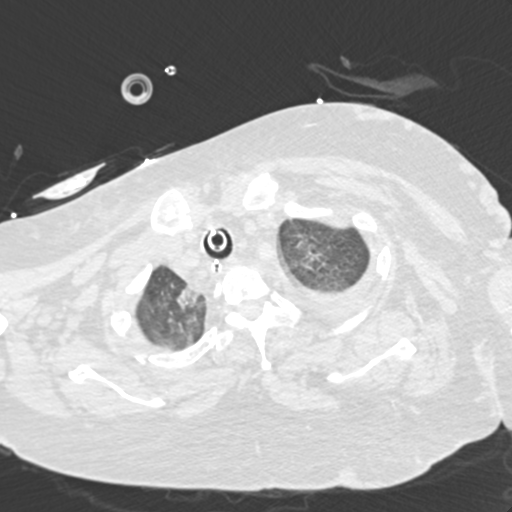
[im 198/233  mediastinal]
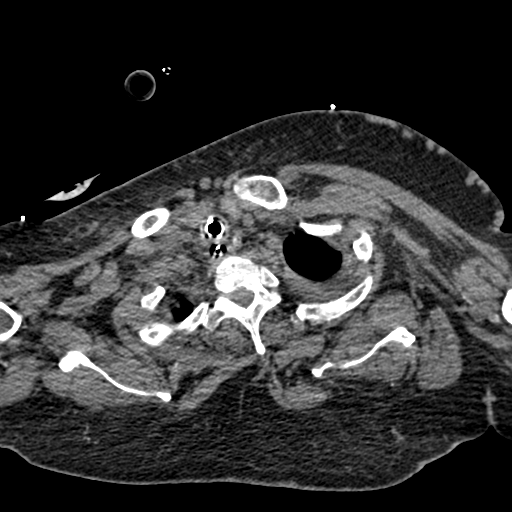
[im 209/233  lung]
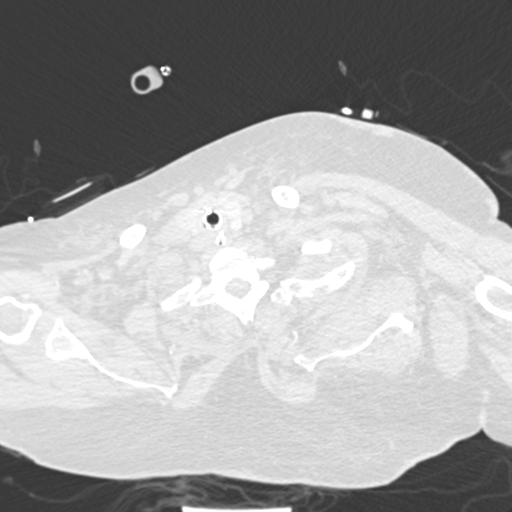
[im 221/233  mediastinal]
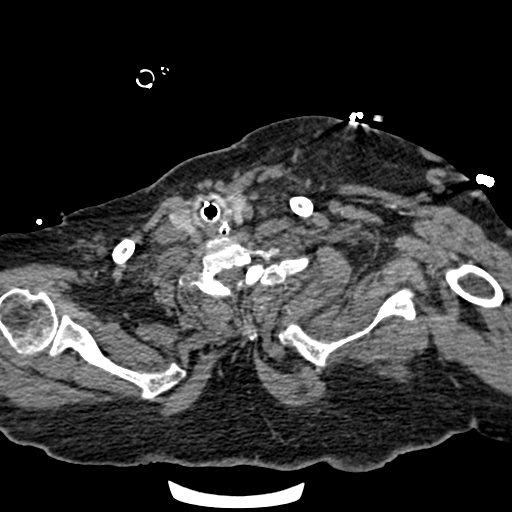

[18 of 36 positions shown; findings below may reference images not displayed]

FINDINGS: Cardiovascular: Satisfactory opacification of the pulmonary arteries
to the segmental level. No evidence of pulmonary embolism. Mild
cardiac enlargement is noted. Minimal pericardial effusion is noted.
Coronary artery calcifications are noted. Atherosclerosis of
thoracic aorta is noted without aneurysm formation.

Mediastinum/Nodes: Endotracheal and nasogastric tubes are in grossly
good position. Stable mediastinal adenopathy is noted as described
on prior exam.

Lungs/Pleura: Mild bilateral pleural effusions are noted with
adjacent subsegmental atelectasis. Airspace opacity is noted in
right upper lobe anteriorly as well as in left lower lobe concerning
for possible pneumonia. No pneumothorax is noted.

Upper Abdomen: No acute abnormality.

Musculoskeletal: No chest wall abnormality. No acute or significant
osseous findings.

Review of the MIP images confirms the above findings.
IMPRESSION: No definite evidence of pulmonary embolus.

Aortic atherosclerosis.

Minimal pericardial effusion.

Coronary artery calcifications noted suggesting coronary artery
disease.

Mild bilateral pleural effusions are noted with adjacent
subsegmental atelectasis.

Airspace opacity is noted in right upper lobe and left lower lobe
concerning for possible pneumonia.

Enlarged mediastinal adenopathy is again noted which may be
inflammatory or reactive in etiology, but metastatic disease cannot
be excluded. Clinical correlation is recommended.

## 2018-04-23 IMAGING — DX DG CHEST 1V PORT
1 series · 1 of 1 positions shown · non-contrast
Comparison: 12/06/2016 at [DATE] a.m.

CLINICAL DATA: Acute respiratory failure.  Intubation.

EXAM:
PORTABLE CHEST 1 VIEW [DATE] a.m.

[chest ap]
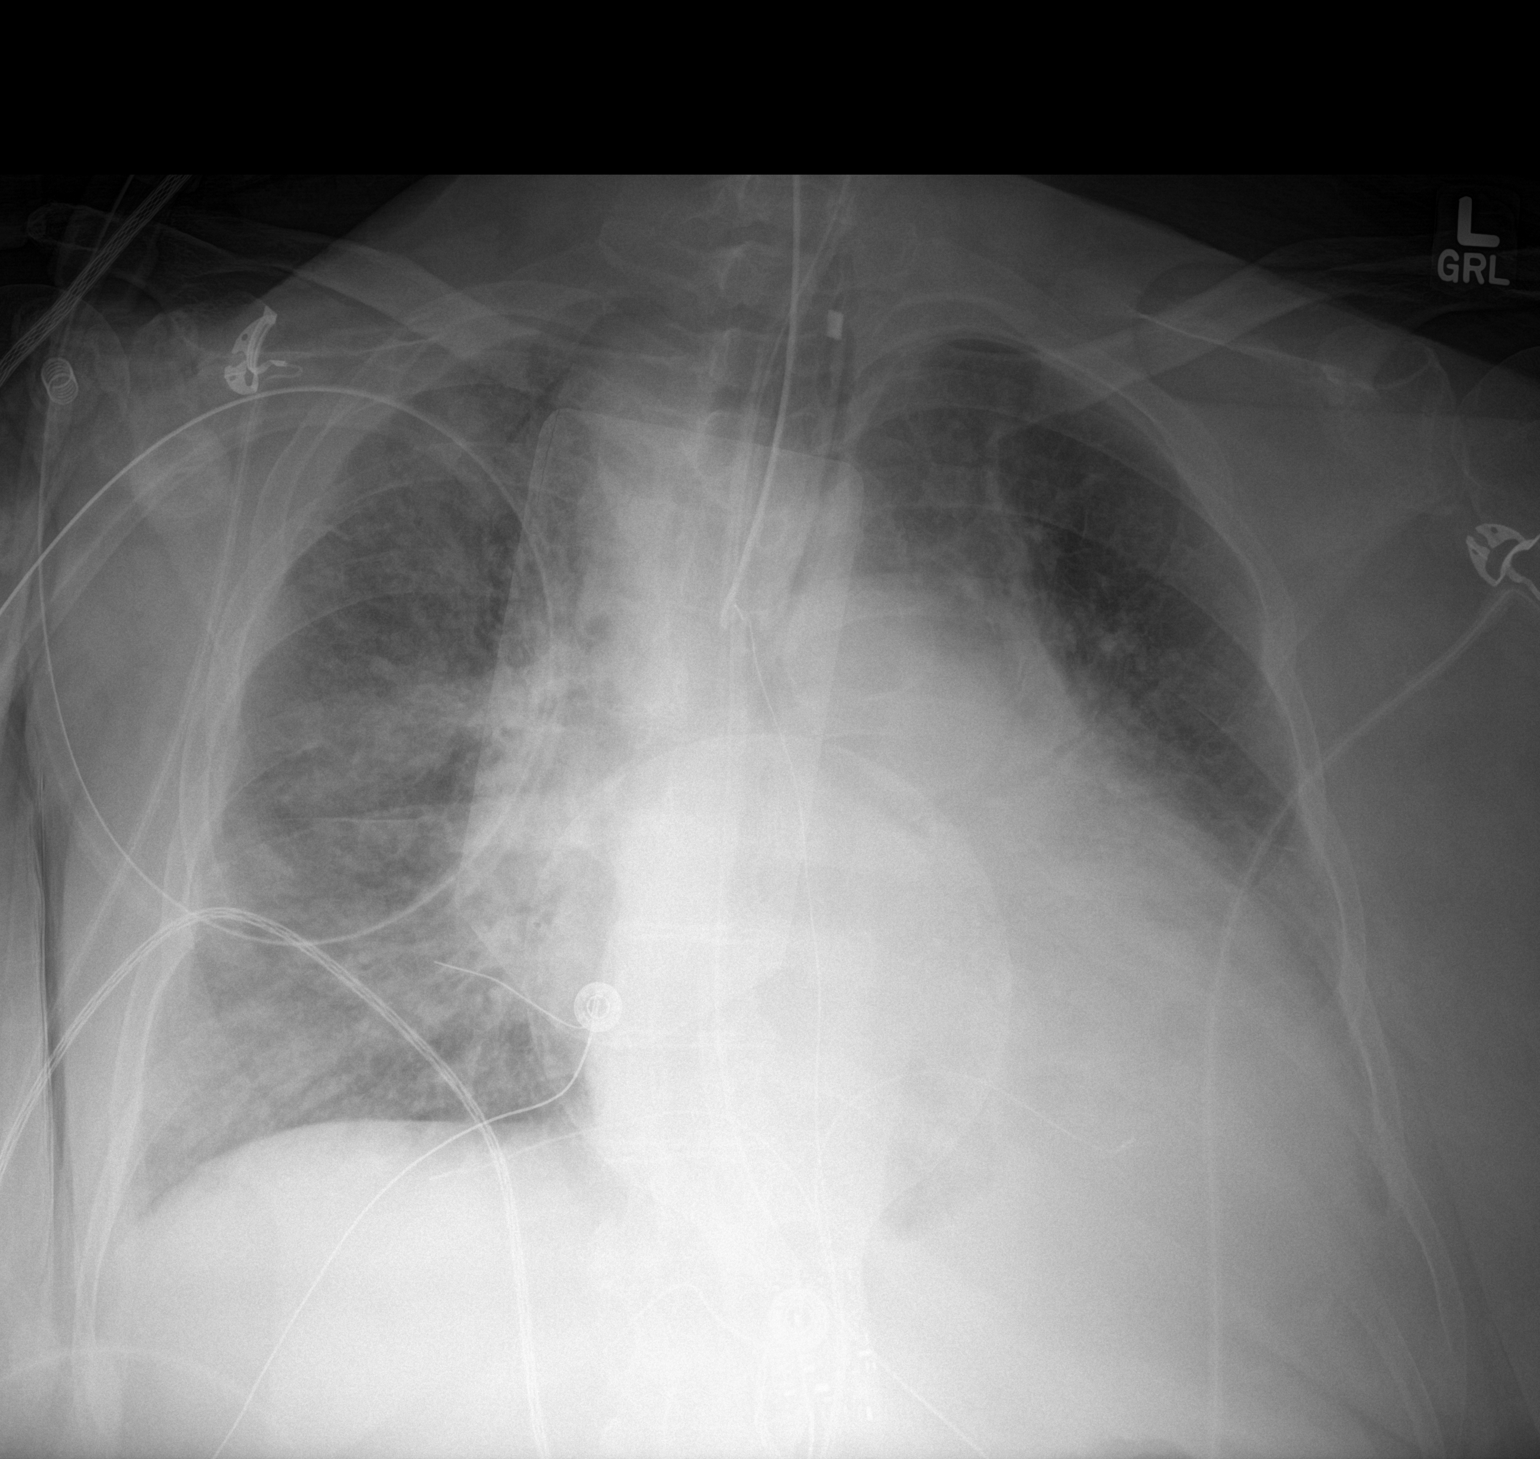

[1 of 1 positions shown; findings below may reference images not displayed]

FINDINGS: Endotracheal tube and OG tube appear in good position. Progressive
bilateral pulmonary edema, left more than right. Small bilateral
pleural effusions.
IMPRESSION: Progressive bilateral pulmonary edema with small bilateral pleural
effusions.
